# Patient Record
Sex: Male | Born: 1995 | Race: White | Hispanic: No | Marital: Single | Smoking: Never smoker
Health system: Southern US, Community
[De-identification: ages and names within clinical notes are randomized; demographics above are authoritative.]

## PROBLEM LIST (undated history)

## (undated) DIAGNOSIS — F909 Attention-deficit hyperactivity disorder, unspecified type: Secondary | ICD-10-CM

## (undated) HISTORY — PX: OTHER SURGICAL HISTORY: SHX169

---
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
## 2017-10-03 DIAGNOSIS — I861 Scrotal varices: Secondary | ICD-10-CM | POA: Diagnosis not present

## 2017-11-16 DIAGNOSIS — H02816 Retained foreign body in left eye, unspecified eyelid: Secondary | ICD-10-CM | POA: Diagnosis not present

## 2017-11-17 DIAGNOSIS — T1502XA Foreign body in cornea, left eye, initial encounter: Secondary | ICD-10-CM | POA: Diagnosis not present

## 2017-12-05 DIAGNOSIS — R05 Cough: Secondary | ICD-10-CM | POA: Diagnosis not present

## 2017-12-05 DIAGNOSIS — J069 Acute upper respiratory infection, unspecified: Secondary | ICD-10-CM | POA: Diagnosis not present

## 2017-12-14 DIAGNOSIS — J029 Acute pharyngitis, unspecified: Secondary | ICD-10-CM | POA: Diagnosis not present

## 2019-12-31 ENCOUNTER — Other Ambulatory Visit: Payer: Self-pay

## 2019-12-31 ENCOUNTER — Ambulatory Visit
Admission: EM | Admit: 2019-12-31 | Discharge: 2019-12-31 | Disposition: A | Discharge status: Home / Self Care | Payer: 59

## 2019-12-31 DIAGNOSIS — M79671 Pain in right foot: Secondary | ICD-10-CM

## 2019-12-31 NOTE — ED Provider Notes (Signed)
[facility]    CSN: [id number] Arrival date & time: 12/31/19  0947      History   Chief Complaint Chief Complaint  Patient presents with   Foot Pain    HPI Richard Drake is a 24 y.o. male.   24 year old male comes in for right foot pain after waking up this morning.  States similar symptoms occurred 3 days ago, and resolved after a few hours.  Woke up with some redness and swelling to the top of the right foot medially, with redness improving on its own without management.  However, has had aching to the area and painful weightbearing.  Denies numbness, tingling.  Denies injury/trauma.  Patient is a regular runner, and works as a IT sales professional.  Although physically demanding, denies anything out of the norm.  Has not tried anything for the symptoms.     Past Medical History:  Diagnosis Date   ADHD (attention deficit hyperactivity disorder)    hx. of    Patient Active Problem List   Diagnosis Date Noted   Varicocele 05/15/2015    Past Surgical History:  Procedure Laterality Date   ROBOT ASSISTED LAPAROSCOPIC RADICAL PROSTATECTOMY Left 05/15/2015   Procedure: ROBOTIC ASSISTED LEFT VARICOCELECTOMY;  Surgeon: Sebastian Ache, MD;  Location: [facility];  Service: Urology;  Laterality: Left;   wisdom teeth extractions         Home Medications    Prior to Admission medications   Medication Sig Start Date End Date Taking? Authorizing Provider  busPIRone (BUSPAR) 10 MG tablet Take 10 mg by mouth 2 (two) times daily.   Yes [provider]  Multiple Vitamin (MULTIVITAMIN WITH MINERALS) TABS tablet Take 1 tablet by mouth daily.   Yes [provider]    Family History History reviewed. No pertinent family history.  Social History Social History   Tobacco Use   Smoking status: Never Smoker   Smokeless tobacco: Never Used  Substance Use Topics   Alcohol use: Yes    Comment: social   Drug use: No     Allergies   Patient has no known  allergies.   Review of Systems Review of Systems  Reason unable to perform ROS: See HPI as above.     Physical Exam Triage Vital Signs ED Triage Vitals  Enc Vitals Group     BP 12/31/19 1027 137/85     Pulse Rate 12/31/19 1027 88     Resp 12/31/19 1027 18     Temp 12/31/19 1027 99.9 F (37.7 C)     Temp Source 12/31/19 1027 Oral     SpO2 12/31/19 1027 98 %     Weight --      Height --      Head Circumference --      Peak Flow --      Pain Score 12/31/19 1028 6     Pain Loc --      Pain Edu? --      Excl. in GC? --    No data found.  Updated Vital Signs BP 137/85 (BP Location: Left Arm)    Pulse 88    Temp 99.9 F (37.7 C) (Oral)    Resp 18    SpO2 98%   Physical Exam Constitutional:      General: He is not in acute distress.    Appearance: Normal appearance. He is well-developed. He is not toxic-appearing or diaphoretic.  HENT:     Head: Normocephalic and atraumatic.  Eyes:  Conjunctiva/sclera: Conjunctivae normal.     Pupils: Pupils are equal, round, and reactive to light.  Pulmonary:     Effort: Pulmonary effort is normal. No respiratory distress.     Comments: Speaking in full sentences without difficulty Musculoskeletal:     Cervical back: Normal range of motion and neck supple.     Comments: No obvious erythema, warmth, swelling, contusion. Tenderness to palpation along 1st-2nd MTP. No tenderness to the ankle. Full ROM of ankle and toes, with pain during dorsiflexion and extension of ankle. Sensation intact and equal. Pedal pulse 2+  Skin:    General: Skin is warm and dry.  Neurological:     Mental Status: He is alert and oriented to person, place, and time.      UC Treatments / Results  Labs (all labs ordered are listed, but only abnormal results are displayed) Labs Reviewed - No data to display  EKG   Radiology No results found.  Procedures Procedures (including critical care time)  Medications Ordered in UC Medications - No data to  display  Initial Impression / Assessment and Plan / UC Course  I have reviewed the triage vital signs and the nursing notes.  Pertinent labs & imaging results that were available during my care of the patient were reviewed by me and considered in my medical decision making (see chart for details).    Discussed likely tendinitis causing symptoms.  Will start NSAIDs, ice compress, rest, Ace wrap during activity.  Expected course of healing discussed.  Return precautions given.  Final Clinical Impressions(s) / UC Diagnoses   Final diagnoses:  Right foot pain    ED Prescriptions    None     PDMP not reviewed this encounter.   Belinda Fisher, PA-C 12/31/19 1118

## 2019-12-31 NOTE — Discharge Instructions (Addendum)
Naproxen 440mg  twice a day for the next 5-7 days. Voltaren gel 2g 4 times a day as needed. Ice compress, rest, ace wrap during activity. Follow up with sports medicine if symptoms does not improving, or reoccurs often.

## 2019-12-31 NOTE — ED Triage Notes (Signed)
Pt c/o top of rt foot pain, redness, and swelling since this am. States same thing happen 3 days ago and it went away. Denies injury, states unable to bare weight.

## 2020-12-14 ENCOUNTER — Encounter: Payer: Self-pay | Admitting: Family Medicine

## 2020-12-14 ENCOUNTER — Ambulatory Visit (INDEPENDENT_AMBULATORY_CARE_PROVIDER_SITE_OTHER): Payer: 59 | Admitting: Family Medicine

## 2020-12-14 ENCOUNTER — Other Ambulatory Visit: Payer: Self-pay

## 2020-12-14 VITALS — BP 137/81 | HR 88 | Temp 98.1°F | Ht 71.0 in | Wt 181.3 lb

## 2020-12-14 DIAGNOSIS — F909 Attention-deficit hyperactivity disorder, unspecified type: Secondary | ICD-10-CM | POA: Diagnosis not present

## 2020-12-14 DIAGNOSIS — Z79899 Other long term (current) drug therapy: Secondary | ICD-10-CM

## 2020-12-14 DIAGNOSIS — F419 Anxiety disorder, unspecified: Secondary | ICD-10-CM | POA: Diagnosis not present

## 2020-12-14 DIAGNOSIS — A630 Anogenital (venereal) warts: Secondary | ICD-10-CM | POA: Insufficient documentation

## 2020-12-14 DIAGNOSIS — Z114 Encounter for screening for human immunodeficiency virus [HIV]: Secondary | ICD-10-CM | POA: Diagnosis not present

## 2020-12-14 DIAGNOSIS — A530 Latent syphilis, unspecified as early or late: Secondary | ICD-10-CM | POA: Insufficient documentation

## 2020-12-14 MED ORDER — ESCITALOPRAM OXALATE 10 MG PO TABS: ORAL_TABLET | ORAL | 3 refills | Status: DC

## 2020-12-14 NOTE — Progress Notes (Signed)
Richard Drake - 25 y.o. male MRN [id number]  Date of birth: 1996/02/04  Subjective Chief Complaint  Patient presents with   Establish Care    HPI Richard Drake is a 25 y.o. male here today for initial visit to establish care.  He has a history of ADHD, anxiety, latent syphilis and genital warts.    He was prescribed buspirone previously for treatment of anxiety but did not feel that this was very effective.  He has not tried any SSRI's for treatment of anxiety but would be open to trying this.   He has history of latent syphilis that was treated with PCN with reduction in titers after treatment.  He has also had genital/anal warts and had these treated by dermatology with cryosurgery.  He is potentially interested in starting PrEP for HIV prevention.  He has never taken this before.  He is concerned about renal side effects related to Truvada and would prefer to take Descovy.    ROS:  A comprehensive ROS was completed and negative except as noted per HPI  Allergies  Allergen Reactions   Fluoxetine Other (See Comments)    Past Medical History:  Diagnosis Date   ADHD (attention deficit hyperactivity disorder)    hx. of    Past Surgical History:  Procedure Laterality Date   ROBOT ASSISTED LAPAROSCOPIC RADICAL PROSTATECTOMY Left 05/15/2015   Procedure: ROBOTIC ASSISTED LEFT VARICOCELECTOMY;  Surgeon: Sebastian Ache, MD;  Location: [facility];  Service: Urology;  Laterality: Left;   wisdom teeth extractions      Social History   Socioeconomic History   Marital status: Single    Spouse name: Not on file   Number of children: Not on file   Years of education: Not on file   Highest education level: Not on file  Occupational History   Not on file  Tobacco Use   Smoking status: Never   Smokeless tobacco: Never  Vaping Use   Vaping Use: Former  Substance and Sexual Activity   Alcohol use: Yes    Alcohol/week: 2.0 - 3.0 standard drinks    Types: 2 - 3 Standard drinks or equivalent  per week    Comment: social   Drug use: No   Sexual activity: Yes    Partners: Male    Birth control/protection: Condom  Other Topics Concern   Not on file  Social History Narrative   Not on file   Social Determinants of Health   Financial Resource Strain: Not on file  Food Insecurity: Not on file  Transportation Needs: Not on file  Physical Activity: Not on file  Stress: Not on file  Social Connections: Not on file    Family History  Problem Relation Age of Onset   Prostate cancer Paternal Grandfather     Health Maintenance  Topic Date Due   Pneumococcal Vaccine 4-71 Years old (1 - PCV) Never done   Hepatitis C Screening  Never done   COVID-19 Vaccine (3 - Moderna risk series) 02/20/2020   INFLUENZA VACCINE  01/25/2021   TETANUS/TDAP  08/21/2029   HPV VACCINES  Completed   HIV Screening  Completed     ----------------------------------------------------------------------------------------------------------------------------------------------------------------------------------------------------------------- Physical Exam BP 137/81 (BP Location: Left Arm, Patient Position: Sitting, Cuff Size: Normal)   Pulse 88   Temp 98.1 F (36.7 C)   Ht 5\' 11"  (1.803 m)   Wt 181 lb 4.8 oz (82.2 kg)   SpO2 100%   BMI 25.29 kg/m   Physical Exam Constitutional:  Appearance: Normal appearance.  HENT:     Head: Normocephalic and atraumatic.  Eyes:     General: No scleral icterus. Cardiovascular:     Rate and Rhythm: Normal rate and regular rhythm.  Pulmonary:     Effort: Pulmonary effort is normal.     Breath sounds: Normal breath sounds.  Musculoskeletal:     Cervical back: Neck supple.  Skin:    General: Skin is warm and dry.  Neurological:     General: No focal deficit present.     Mental Status: He is alert.  Psychiatric:        Mood and Affect: Mood normal.        Behavior: Behavior normal.     ------------------------------------------------------------------------------------------------------------------------------------------------------------------------------------------------------------------- Assessment and Plan  Anxiety Treatment options discussed.   Start lexapro 10mg  daily.  Side effects reviewed including sexual side effects. Return in about 4 weeks (around 01/11/2021) for Anxiety.   Attention deficit hyperactivity disorder (ADHD) Referral to psychology for evaluation of adult ADD  Screening for HIV (human immunodeficiency virus) PrEP dsicussed including need to monitor renal function and HIV testing q3 months. Hep b testing ordered today. Check HIV antibody.  He is concerned about potential renal side effects and we'll see if we can get Descovy approved.    Meds ordered this encounter  Medications   DISCONTD: escitalopram (LEXAPRO) 10 MG tablet    Sig: Take 1/2 tab po daily x1 week then increase to full tab daily    Dispense:  30 tablet    Refill:  3   escitalopram (LEXAPRO) 10 MG tablet    Sig: Take 1/2 tab po daily x1 week then increase to full tab daily    Dispense:  30 tablet    Refill:  3    Return in about 4 weeks (around 01/11/2021) for Anxiety.    This visit occurred during the SARS-CoV-2 public health emergency.  Safety protocols were in place, including screening questions prior to the visit, additional usage of staff PPE, and extensive cleaning of exam room while observing appropriate contact time as indicated for disinfecting solutions.

## 2020-12-14 NOTE — Patient Instructions (Signed)
Great to meet you today! Let's try lexapro 1/2 tab x1 week then increase to

## 2020-12-15 LAB — BASIC METABOLIC PANEL
BUN: 15 mg/dL (ref 7–25)
CO2: 26 mmol/L (ref 20–32)
Calcium: 10 mg/dL (ref 8.6–10.3)
Chloride: 103 mmol/L (ref 98–110)
Creat: 0.79 mg/dL (ref 0.60–1.35)
Glucose, Bld: 97 mg/dL (ref 65–99)
Potassium: 4.3 mmol/L (ref 3.5–5.3)
Sodium: 140 mmol/L (ref 135–146)

## 2020-12-15 LAB — HIV ANTIBODY (ROUTINE TESTING W REFLEX): HIV 1&2 Ab, 4th Generation: NONREACTIVE

## 2020-12-15 LAB — HEPATITIS B CORE ANTIBODY, TOTAL: Hep B Core Total Ab: NONREACTIVE

## 2020-12-15 LAB — HEPATITIS B SURFACE ANTIBODY,QUALITATIVE: Hep B S Ab: REACTIVE — AB

## 2020-12-16 ENCOUNTER — Encounter: Payer: Self-pay | Admitting: Family Medicine

## 2020-12-16 ENCOUNTER — Other Ambulatory Visit: Payer: Self-pay | Admitting: Family Medicine

## 2020-12-16 MED ORDER — DESCOVY 200-25 MG PO TABS: 1.0000 | ORAL_TABLET | Freq: Every day | ORAL | 2 refills | Status: DC

## 2020-12-17 ENCOUNTER — Encounter: Payer: Self-pay | Admitting: Family Medicine

## 2020-12-17 DIAGNOSIS — F909 Attention-deficit hyperactivity disorder, unspecified type: Secondary | ICD-10-CM | POA: Insufficient documentation

## 2020-12-17 DIAGNOSIS — Z114 Encounter for screening for human immunodeficiency virus [HIV]: Secondary | ICD-10-CM | POA: Insufficient documentation

## 2020-12-17 NOTE — Assessment & Plan Note (Signed)
Referral to psychology for evaluation of adult ADD

## 2020-12-17 NOTE — Assessment & Plan Note (Addendum)
Treatment options discussed.   Start lexapro 10mg  daily.  Side effects reviewed including sexual side effects. Return in about 4 weeks (around 01/11/2021) for Anxiety.

## 2020-12-17 NOTE — Assessment & Plan Note (Signed)
PrEP dsicussed including need to monitor renal function and HIV testing q3 months. Hep b testing ordered today. Check HIV antibody.  He is concerned about potential renal side effects and we'll see if we can get Descovy approved.

## 2020-12-18 NOTE — Telephone Encounter (Signed)
Please let patient know that Descovy denied.  Does he want to try Truvada?

## 2021-01-12 ENCOUNTER — Ambulatory Visit: Payer: 59 | Admitting: Family Medicine

## 2021-01-19 ENCOUNTER — Encounter: Payer: Self-pay | Admitting: *Deleted

## 2021-01-19 NOTE — Telephone Encounter (Signed)
My chart message sent to pt.

## 2021-01-25 ENCOUNTER — Encounter: Payer: Self-pay | Admitting: Family Medicine

## 2021-01-25 ENCOUNTER — Other Ambulatory Visit: Payer: Self-pay

## 2021-01-25 ENCOUNTER — Ambulatory Visit: Payer: 59 | Admitting: Family Medicine

## 2021-01-25 DIAGNOSIS — A630 Anogenital (venereal) warts: Secondary | ICD-10-CM | POA: Insufficient documentation

## 2021-01-25 DIAGNOSIS — F909 Attention-deficit hyperactivity disorder, unspecified type: Secondary | ICD-10-CM | POA: Diagnosis not present

## 2021-01-25 DIAGNOSIS — Z114 Encounter for screening for human immunodeficiency virus [HIV]: Secondary | ICD-10-CM

## 2021-01-25 DIAGNOSIS — F419 Anxiety disorder, unspecified: Secondary | ICD-10-CM

## 2021-01-25 MED ORDER — ESCITALOPRAM OXALATE 20 MG PO TABS
ORAL_TABLET | ORAL | 3 refills | Status: DC
Start: 2021-01-25 — End: 2021-03-29

## 2021-01-25 NOTE — Assessment & Plan Note (Signed)
Anxiety has improved however still feels like he could have better control.  Will increase dose of Lexapro as he is tolerating this well so far.

## 2021-01-25 NOTE — Progress Notes (Signed)
Richard Drake - 25 y.o. male MRN [id number]  Date of birth: 03/22/1996  Subjective Chief Complaint  Patient presents with   Follow-up    HPI Richard Drake is a 25 year old male here today for follow-up visit.  We discussed starting PrEP at his previous visit.  He was concerned about a possible renal issues related to Truvada.  He did want to try Descovy however insurance would not cover this without initial trial of Truvada.  Reports that he does not want to try Truvada at this time.  He was started on Lexapro at last visit.  He reports that he is doing well with this.  He has had much less side effects with this compared to fluoxetine and buspirone.  He does continue to have some breakthrough anxiety and would like to try increasing the dose on his Lexapro.    Depression screen [employer] 2/9 01/25/2021 12/14/2020  Decreased Interest 1 1  Down, Depressed, Hopeless 1 1  PHQ - 2 Score 2 2  Altered sleeping 2 3  Tired, decreased energy 2 3  Change in appetite 1 2  Feeling bad or failure about yourself  0 1  Trouble concentrating 1 3  Moving slowly or fidgety/restless 0 1  Suicidal thoughts 0 0  PHQ-9 Score 8 15  Difficult doing work/chores Very difficult Somewhat difficult   GAD 7 : Generalized Anxiety Score 01/25/2021 12/14/2020  Nervous, Anxious, on Edge 2 3  Control/stop worrying 1 2  Worry too much - different things 1 1  Trouble relaxing 1 2  Restless 2 1  Easily annoyed or irritable 0 0  Afraid - awful might happen 0 0  Total GAD 7 Score 7 9  Anxiety Difficulty Somewhat difficult Somewhat difficult    ROS:  A comprehensive ROS was completed and negative except as noted per HPI  Allergies  Allergen Reactions   Fluoxetine Other (See Comments)    Past Medical History:  Diagnosis Date   ADHD (attention deficit hyperactivity disorder)    hx. of    Past Surgical History:  Procedure Laterality Date   ROBOT ASSISTED LAPAROSCOPIC RADICAL PROSTATECTOMY Left 05/15/2015   Procedure:  ROBOTIC ASSISTED LEFT VARICOCELECTOMY;  Surgeon: Sebastian Ache, MD;  Location: [facility];  Service: Urology;  Laterality: Left;   wisdom teeth extractions      Social History   Socioeconomic History   Marital status: Single    Spouse name: Not on file   Number of children: Not on file   Years of education: Not on file   Highest education level: Not on file  Occupational History   Not on file  Tobacco Use   Smoking status: Never   Smokeless tobacco: Never  Vaping Use   Vaping Use: Former  Substance and Sexual Activity   Alcohol use: Yes    Alcohol/week: 2.0 - 3.0 standard drinks    Types: 2 - 3 Standard drinks or equivalent per week    Comment: social   Drug use: No   Sexual activity: Yes    Partners: Male    Birth control/protection: Condom  Other Topics Concern   Not on file  Social History Narrative   Not on file   Social Determinants of Health   Financial Resource Strain: Not on file  Food Insecurity: Not on file  Transportation Needs: Not on file  Physical Activity: Not on file  Stress: Not on file  Social Connections: Not on file    Family History  Problem Relation Age of  Onset   Prostate cancer Paternal Grandfather     Health Maintenance  Topic Date Due   Pneumococcal Vaccine 45-37 Years old (1 - PCV) Never done   Hepatitis C Screening  Never done   COVID-19 Vaccine (3 - Moderna risk series) 02/20/2020   INFLUENZA VACCINE  01/25/2021   TETANUS/TDAP  08/21/2029   HPV VACCINES  Completed   HIV Screening  Completed     ----------------------------------------------------------------------------------------------------------------------------------------------------------------------------------------------------------------- Physical Exam BP 124/78 (BP Location: Left Arm, Patient Position: Sitting, Cuff Size: Normal)   Pulse 83   Temp 97.7 F (36.5 C)   Ht 5\' 11"  (1.803 m)   Wt 178 lb (80.7 kg)   SpO2 98%   BMI 24.83 kg/m   Physical  Exam Constitutional:      Appearance: Normal appearance.  Skin:    General: Skin is warm.  Neurological:     Mental Status: He is alert.  Psychiatric:        Mood and Affect: Mood normal.        Behavior: Behavior normal.    ------------------------------------------------------------------------------------------------------------------------------------------------------------------------------------------------------------------- Assessment and Plan  Attention deficit hyperactivity disorder (ADHD) Appointment pending with [place] attention specialist for evaluation of ADD.  Anxiety Anxiety has improved however still feels like he could have better control.  Will increase dose of Lexapro as he is tolerating this well so far.  Screening for HIV (human immunodeficiency virus) Medications for PrEP discussed previously.  He would like to hold off on starting Truvada at this time.  Condoms encouraged.   Meds ordered this encounter  Medications   escitalopram (LEXAPRO) 20 MG tablet    Sig: Take 20mg  PO daily    Dispense:  30 tablet    Refill:  3    Return in about 2 months (around 03/27/2021) for GAD.    This visit occurred during the SARS-CoV-2 public health emergency.  Safety protocols were in place, including screening questions prior to the visit, additional usage of staff PPE, and extensive cleaning of exam room while observing appropriate contact time as indicated for disinfecting solutions.

## 2021-01-25 NOTE — Assessment & Plan Note (Signed)
Appointment pending with [place] attention specialist for evaluation of ADD.

## 2021-01-25 NOTE — Assessment & Plan Note (Addendum)
Medications for PrEP discussed previously.  He would like to hold off on starting Truvada at this time.  Condoms encouraged.

## 2021-01-25 NOTE — Patient Instructions (Signed)
Great to see you today! Increase lexapro to 20mg  daily

## 2021-02-16 ENCOUNTER — Encounter: Payer: Self-pay | Admitting: Family Medicine

## 2021-02-16 ENCOUNTER — Other Ambulatory Visit: Payer: Self-pay | Admitting: Family Medicine

## 2021-02-16 MED ORDER — ONDANSETRON 4 MG PO TBDP: 4.0000 mg | ORAL_TABLET | Freq: Three times a day (TID) | ORAL | 0 refills | Status: DC | PRN

## 2021-03-17 ENCOUNTER — Encounter: Payer: Self-pay | Admitting: Family Medicine

## 2021-03-19 ENCOUNTER — Ambulatory Visit
Admission: EM | Admit: 2021-03-19 | Discharge: 2021-03-19 | Disposition: A | Discharge status: Home / Self Care | Payer: 59 | Attending: Internal Medicine | Admitting: Internal Medicine

## 2021-03-19 ENCOUNTER — Ambulatory Visit (INDEPENDENT_AMBULATORY_CARE_PROVIDER_SITE_OTHER): Payer: 59

## 2021-03-19 ENCOUNTER — Other Ambulatory Visit: Payer: Self-pay

## 2021-03-19 DIAGNOSIS — J208 Acute bronchitis due to other specified organisms: Secondary | ICD-10-CM

## 2021-03-19 DIAGNOSIS — R059 Cough, unspecified: Secondary | ICD-10-CM

## 2021-03-19 DIAGNOSIS — J029 Acute pharyngitis, unspecified: Secondary | ICD-10-CM | POA: Diagnosis not present

## 2021-03-19 LAB — POCT RAPID STREP A (OFFICE): Rapid Strep A Screen: NEGATIVE

## 2021-03-19 MED ORDER — PREDNISONE 20 MG PO TABS: 40.0000 mg | ORAL_TABLET | Freq: Every day | ORAL | 0 refills | Status: AC

## 2021-03-19 MED ORDER — PROMETHAZINE-DM 6.25-15 MG/5ML PO SYRP: 5.0000 mL | ORAL_SOLUTION | Freq: Four times a day (QID) | ORAL | 0 refills | Status: DC | PRN

## 2021-03-19 MED ORDER — ALBUTEROL SULFATE HFA 108 (90 BASE) MCG/ACT IN AERS: 1.0000 | INHALATION_SPRAY | Freq: Four times a day (QID) | RESPIRATORY_TRACT | 0 refills | Status: DC | PRN

## 2021-03-19 NOTE — ED Provider Notes (Signed)
[facility]    CSN: [id number] Arrival date & time: 03/19/21  3300      History   Chief Complaint Chief Complaint  Patient presents with   Cough    HPI Richard Drake is a 25 y.o. male.   Patient presents with productive cough that has been present for approximately 2 weeks.  Patient also had some nasal congestion  when symptoms first started symptoms but has now resolved.  Cough is productive with yellow-colored phlegm.  Denies any chest pain or shortness of breath.  Did have low-grade fever 99 at home.  Has been taking Zyrtec at home without any relief.   Cough  Past Medical History:  Diagnosis Date   ADHD (attention deficit hyperactivity disorder)    hx. 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 Authorizing Provider  albuterol (VENTOLIN HFA) 108 (90 Base) MCG/ACT inhaler Inhale 1-2 puffs into the lungs every 6 (six) hours as needed for wheezing or shortness of breath (cough). 03/19/21  Yes Lance Muss, FNP  predniSONE (DELTASONE) 20 MG tablet Take 2 tablets (40 mg total) by mouth daily for 5 days. 03/19/21 03/24/21 Yes Lance Muss, FNP  promethazine-dextromethorphan (PROMETHAZINE-DM) 6.25-15 MG/5ML syrup Take 5 mLs by mouth 4 (four) times daily as needed for cough. 03/19/21  Yes Lance Muss, FNP   escitalopram (LEXAPRO) 20 MG tablet Take 20mg  PO daily 01/25/21   03/27/21, DO  imiquimod (ALDARA) 5 % cream 1 application at bedtime, leave on for 8 hours then wash off    [provider]  Multiple Vitamin (MULTIVITAMIN WITH MINERALS) TABS tablet Take 1 tablet by mouth daily.    [provider]  ondansetron (ZOFRAN ODT) 4 MG disintegrating tablet Take 1 tablet (4 mg total) by mouth every 8 (eight) hours as needed for nausea or vomiting. 02/16/21   02/18/21, DO    Family History Family History  Problem Relation Age of Onset   Prostate cancer Paternal Grandfather     Social History Social History   Tobacco Use   Smoking status: Never   Smokeless tobacco: Never  Vaping Use   Vaping Use: Former  Substance Use Topics   Alcohol use: Yes    Alcohol/week: 2.0 - 3.0 standard drinks    Types: 2 - 3 Standard drinks or equivalent per week    Comment: social   Drug use: No     Allergies   Fluoxetine   Review of Systems Review of Systems Per HPI  Physical Exam Triage Vital Signs ED Triage Vitals  Enc Vitals Group     BP 03/19/21 0837 (!) 130/92     Pulse Rate 03/19/21 0837 74     Resp 03/19/21 0837 20     Temp 03/19/21 0837 98.4 F (36.9 C)  Temp Source 03/19/21 0837 Oral     SpO2 03/19/21 0837 98 %     Weight --      Height --      Head Circumference --      Peak Flow --      Pain Score 03/19/21 0839 0     Pain Loc --      Pain Edu? --      Excl. in GC? --    No data found.  Updated Vital Signs BP (!) 130/92 (BP Location: Left Arm)   Pulse 74   Temp 98.4 F (36.9 C) (Oral)   Resp 20   SpO2 98%   Visual Acuity Right Eye Distance:   Left Eye Distance:   Bilateral Distance:    Right Eye Near:   Left Eye Near:    Bilateral Near:     Physical Exam Constitutional:      General: He is not in acute distress.    Appearance: Normal appearance. He is not ill-appearing, toxic-appearing or diaphoretic.  HENT:     Head:  Normocephalic and atraumatic.     Right Ear: Tympanic membrane and ear canal normal.     Left Ear: Tympanic membrane and ear canal normal.     Nose: Nose normal.     Mouth/Throat:     Mouth: Mucous membranes are moist.     Pharynx: Posterior oropharyngeal erythema present.  Eyes:     Extraocular Movements: Extraocular movements intact.     Conjunctiva/sclera: Conjunctivae normal.  Cardiovascular:     Rate and Rhythm: Normal rate and regular rhythm.     Pulses: Normal pulses.     Heart sounds: Normal heart sounds.  Pulmonary:     Effort: Pulmonary effort is normal. No respiratory distress.     Breath sounds: Normal breath sounds. No stridor. No wheezing or rhonchi.  Skin:    General: Skin is warm and dry.  Neurological:     General: No focal deficit present.     Mental Status: He is alert and oriented to person, place, and time. Mental status is at baseline.  Psychiatric:        Mood and Affect: Mood normal.        Behavior: Behavior normal.        Thought Content: Thought content normal.        Judgment: Judgment normal.     UC Treatments / Results  Labs (all labs ordered are listed, but only abnormal results are displayed) Labs Reviewed  CULTURE, GROUP A STREP [facility])  POCT RAPID STREP A (OFFICE)    EKG   Radiology DG Chest 2 View  Result Date: 03/19/2021 CLINICAL DATA:  persistent cough EXAM: CHEST - 2 VIEW COMPARISON:  May 02, 2016 FINDINGS: The cardiomediastinal silhouette is normal in contour. No pleural effusion. No pneumothorax. No acute pleuroparenchymal abnormality. Visualized abdomen is unremarkable. No acute osseous abnormality noted. IMPRESSION: No acute cardiopulmonary abnormality. Electronically Signed   By: Meda Klinefelter M.D.   On: 03/19/2021 09:46    Procedures Procedures (including critical care time)  Medications Ordered in UC Medications - No data to display  Initial Impression / Assessment and Plan / UC Course  I have reviewed the  triage vital signs and the nursing notes.  Pertinent labs & imaging results that were available during my care of the patient were reviewed by me and considered in my medical decision making (see chart for details).     Chest x-ray was negative  for any acute cardiopulmonary process.  Suspect start of viral bronchitis.  Will treat with prednisone x5 days to help decrease inflammation in chest and help with cough.  Also prescribed Promethazine DM to help with cough as needed.  Advised patient that cough medication may cause drowsiness.  Albuterol inhaler prescribed as well.  Do not think COVID-19 testing is necessary at this time due to duration of symptoms.  Patient also has negative COVID-19 test at home recently.  Rapid strep test negative.  Throat culture is pending.  Patient is nontoxic-appearing.Discussed strict return precautions. Patient verbalized understanding and is agreeable with plan.  Final Clinical Impressions(s) / UC Diagnoses   Final diagnoses:  Sore throat  Viral bronchitis  Cough     Discharge Instructions      You have been prescribed prednisone steroid to decrease inflammation and help with cough, a cough medication, and an albuterol inhaler.  Please be advised that cough medication can cause drowsiness.  Follow-up if symptoms persist.  Go to the hospital shortness of breath develops.     ED Prescriptions     Medication Sig Dispense Auth. Provider   predniSONE (DELTASONE) 20 MG tablet Take 2 tablets (40 mg total) by mouth daily for 5 days. 10 tablet Lance Muss, FNP   albuterol (VENTOLIN HFA) 108 (90 Base) MCG/ACT inhaler Inhale 1-2 puffs into the lungs every 6 (six) hours as needed for wheezing or shortness of breath (cough). 1 each Lance Muss, FNP   promethazine-dextromethorphan (PROMETHAZINE-DM) 6.25-15 MG/5ML syrup Take 5 mLs by mouth 4 (four) times daily as needed for cough. 118 mL Lance Muss, FNP      I have reviewed the PDMP during this  encounter.   Lance Muss, FNP 03/19/21 1021

## 2021-03-19 NOTE — ED Triage Notes (Signed)
Pt c/o cough onset about 2 weeks ago. Pt states covid(-) at home. States coworker has PNA and is concerned he may also have PNA.   States tried zyrtec at home without relief.

## 2021-03-19 NOTE — Discharge Instructions (Signed)
You have been prescribed prednisone steroid to decrease inflammation and help with cough, a cough medication, and an albuterol inhaler.  Please be advised that cough medication can cause drowsiness.  Follow-up if symptoms persist.  Go to the hospital shortness of breath develops.

## 2021-03-22 LAB — CULTURE, GROUP A STREP (THRC)

## 2021-03-29 ENCOUNTER — Other Ambulatory Visit: Payer: Self-pay

## 2021-03-29 ENCOUNTER — Ambulatory Visit: Payer: 59 | Admitting: Family Medicine

## 2021-03-29 ENCOUNTER — Encounter: Payer: Self-pay | Admitting: Family Medicine

## 2021-03-29 VITALS — BP 127/82 | HR 80 | Ht 71.0 in | Wt 188.0 lb

## 2021-03-29 DIAGNOSIS — F419 Anxiety disorder, unspecified: Secondary | ICD-10-CM | POA: Diagnosis not present

## 2021-03-29 DIAGNOSIS — A530 Latent syphilis, unspecified as early or late: Secondary | ICD-10-CM | POA: Diagnosis not present

## 2021-03-29 DIAGNOSIS — Z114 Encounter for screening for human immunodeficiency virus [HIV]: Secondary | ICD-10-CM | POA: Diagnosis not present

## 2021-03-29 DIAGNOSIS — Z113 Encounter for screening for infections with a predominantly sexual mode of transmission: Secondary | ICD-10-CM | POA: Diagnosis not present

## 2021-03-29 MED ORDER — HYDROXYZINE PAMOATE 25 MG PO CAPS: 25.0000 mg | ORAL_CAPSULE | Freq: Three times a day (TID) | ORAL | 1 refills | Status: DC | PRN

## 2021-03-29 MED ORDER — ESCITALOPRAM OXALATE 20 MG PO TABS: ORAL_TABLET | ORAL | 3 refills | Status: DC

## 2021-03-29 NOTE — Progress Notes (Signed)
[name] - 25 y.o. male MRN [id number]  Date of birth: 1995-07-29  Subjective Chief Complaint  Patient presents with   Anxiety    HPI Richard Drake is a 25 year old male here today for follow-up of anxiety.  Started on Lexapro earlier this year and is doing quite well with this.  He does continue to have some episodes of breakthrough anxiety and requests a medication he can take as needed for any breakthrough episodes.  He has had no issues with tolerating Lexapro and denies significant side effects.  He would like to have STI screening.  He denies symptoms at this time.  ROS:  A comprehensive ROS was completed and negative except as noted per HPI  Allergies  Allergen Reactions   Fluoxetine Other (See Comments)    Past Medical History:  Diagnosis Date   ADHD (attention deficit hyperactivity disorder)    hx. of    Past Surgical History:  Procedure Laterality Date   ROBOT ASSISTED LAPAROSCOPIC RADICAL PROSTATECTOMY Left 05/15/2015   Procedure: ROBOTIC ASSISTED LEFT VARICOCELECTOMY;  Surgeon: Sebastian Ache, MD;  Location: [facility];  Service: Urology;  Laterality: Left;   wisdom teeth extractions      Social History   Socioeconomic History   Marital status: Single    Spouse name: Not on file   Number of children: Not on file   Years of education: Not on file   Highest education level: Not on file  Occupational History   Not on file  Tobacco Use   Smoking status: Never   Smokeless tobacco: Never  Vaping Use   Vaping Use: Former  Substance and Sexual Activity   Alcohol use: Yes    Alcohol/week: 2.0 - 3.0 standard drinks    Types: 2 - 3 Standard drinks or equivalent per week    Comment: social   Drug use: No   Sexual activity: Yes    Partners: Male    Birth control/protection: Condom  Other Topics Concern   Not on file  Social History Narrative   Not on file   Social Determinants of Health   Financial Resource Strain: Not on file  Food Insecurity: Not on  file  Transportation Needs: Not on file  Physical Activity: Not on file  Stress: Not on file  Social Connections: Not on file    Family History  Problem Relation Age of Onset   Prostate cancer Paternal Grandfather     Health Maintenance  Topic Date Due   Hepatitis C Screening  Never done   COVID-19 Vaccine (3 - Moderna risk series) 02/20/2020   TETANUS/TDAP  08/21/2029   INFLUENZA VACCINE  Completed   HPV VACCINES  Completed   HIV Screening  Completed     ----------------------------------------------------------------------------------------------------------------------------------------------------------------------------------------------------------------- Physical Exam BP 127/82 (BP Location: Left Arm, Patient Position: Sitting, Cuff Size: Normal)   Pulse 80   Ht 5\' 11"  (1.803 m)   Wt 188 lb (85.3 kg)   SpO2 99%   BMI 26.22 kg/m   Physical Exam Constitutional:      Appearance: Normal appearance.  Eyes:     General: No scleral icterus. Musculoskeletal:     Cervical back: Neck supple.  Neurological:     General: No focal deficit present.     Mental Status: He is alert.  Psychiatric:        Mood and Affect: Mood normal.        Behavior: Behavior normal.    ------------------------------------------------------------------------------------------------------------------------------------------------------------------------------------------------------------------- Assessment and Plan  Screening for STD (  sexually transmitted disease) Orders Placed This Encounter  Procedures   Chlamydia/Neisseria Gonorrhoeae RNA,TMA,Urogenital   RPR   HIV antibody (with reflex)     Anxiety Lexapro is working quite well for management of his anxiety.  We will add hydroxyzine as needed for breakthrough anxiety symptoms.  I will plan to see him back in about 4 months.  We discussed that if hydroxyzine is not effective we can discuss alternatives.   Meds ordered this  encounter  Medications   hydrOXYzine (VISTARIL) 25 MG capsule    Sig: Take 1 capsule (25 mg total) by mouth 3 (three) times daily as needed for anxiety.    Dispense:  30 capsule    Refill:  1   escitalopram (LEXAPRO) 20 MG tablet    Sig: Take 20mg  PO daily    Dispense:  30 tablet    Refill:  3    Return in about 4 months (around 07/30/2021) for GAD.    This visit occurred during the SARS-CoV-2 public health emergency.  Safety protocols were in place, including screening questions prior to the visit, additional usage of staff PPE, and extensive cleaning of exam room while observing appropriate contact time as indicated for disinfecting solutions.

## 2021-03-29 NOTE — Patient Instructions (Signed)
Great to see you today! We'll be in touch with lab results.  Try addition of hydroxyzine as needed for anxiety.  See me again in 4 months.

## 2021-03-29 NOTE — Assessment & Plan Note (Signed)
Lexapro is working quite well for management of his anxiety.  We will add hydroxyzine as needed for breakthrough anxiety symptoms.  I will plan to see him back in about 4 months.  We discussed that if hydroxyzine is not effective we can discuss alternatives.

## 2021-03-29 NOTE — Assessment & Plan Note (Signed)
Orders Placed This Encounter  Procedures  . Chlamydia/Neisseria Gonorrhoeae RNA,TMA,Urogenital  . RPR  . HIV antibody (with reflex)

## 2021-04-01 LAB — CHLAMYDIA/NEISSERIA GONORRHOEAE RNA,TMA,UROGENTIAL
C. trachomatis RNA, TMA: NOT DETECTED
N. gonorrhoeae RNA, TMA: NOT DETECTED

## 2021-04-01 LAB — HIV ANTIBODY (ROUTINE TESTING W REFLEX): HIV 1&2 Ab, 4th Generation: NONREACTIVE

## 2021-04-01 LAB — RPR: RPR Ser Ql: NONREACTIVE

## 2021-05-03 ENCOUNTER — Other Ambulatory Visit: Payer: Self-pay

## 2021-05-03 ENCOUNTER — Encounter: Payer: Self-pay | Admitting: Physician Assistant

## 2021-05-03 ENCOUNTER — Ambulatory Visit: Payer: 59 | Admitting: Physician Assistant

## 2021-05-03 ENCOUNTER — Encounter: Payer: Self-pay | Admitting: Family Medicine

## 2021-05-03 VITALS — BP 141/71 | HR 80 | Ht 71.0 in | Wt 192.0 lb

## 2021-05-03 DIAGNOSIS — R1909 Other intra-abdominal and pelvic swelling, mass and lump: Secondary | ICD-10-CM | POA: Diagnosis not present

## 2021-05-03 NOTE — Progress Notes (Signed)
   Subjective:    Patient ID: Richard Drake, male    DOB: 11-Oct-1995, 25 y.o.   MRN: [id number]  HPI Pt is a 25 yo male who presents to the clinic to discuss mass felt of the right inguinal area. He is concerned about a hernia. He does work as a Estate agent heavy thing. He denies feeling any bulging, pressure, pain with lifting. He has noticed for the last week. It is more tender to touch than painful. Never had anything like this before. He has had a varicocele of left testicle. No penile discharge. No fever.   . 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     Objective:   Physical Exam Abdominal:     General: There is no distension.     Palpations: Abdomen is soft.     Tenderness: There is no abdominal tenderness. There is no guarding or rebound.     Hernia: No hernia is present.     Comments: 1cm by 1cm firm but mobile mass of the right inguinal to perineal area that is tender to palpation. No testicular masses or tenderness.  No direct or indirect hernia palpated.  No warmth/redness/swelling.           Assessment & Plan:  Marland KitchenMarland KitchenDamichael was seen today for hernia.  Diagnoses and all orders for this visit:  Mass of right inguinal region -     US Pelvis Limited; Future  I suspect the mass is non inflamed sebaceous cyst of lymph node not hernia. Will get ultrasound to confirm and get a better treatment plan. For now warm compresses and leave  it alone.

## 2021-05-12 ENCOUNTER — Other Ambulatory Visit: Payer: 59

## 2021-05-19 ENCOUNTER — Ambulatory Visit (INDEPENDENT_AMBULATORY_CARE_PROVIDER_SITE_OTHER): Payer: 59

## 2021-05-19 ENCOUNTER — Other Ambulatory Visit: Payer: Self-pay

## 2021-05-19 DIAGNOSIS — R1909 Other intra-abdominal and pelvic swelling, mass and lump: Secondary | ICD-10-CM

## 2021-05-24 NOTE — Progress Notes (Signed)
No acute internal findings. Do you see feel mass?

## 2021-06-14 ENCOUNTER — Encounter: Payer: Self-pay | Admitting: Family Medicine

## 2021-06-14 ENCOUNTER — Ambulatory Visit: Payer: 59 | Admitting: Family Medicine

## 2021-06-14 ENCOUNTER — Other Ambulatory Visit: Payer: Self-pay

## 2021-06-14 VITALS — BP 145/81 | HR 69 | Temp 97.2°F | Ht 71.0 in | Wt 192.0 lb

## 2021-06-14 DIAGNOSIS — F419 Anxiety disorder, unspecified: Secondary | ICD-10-CM

## 2021-06-14 DIAGNOSIS — R5383 Other fatigue: Secondary | ICD-10-CM | POA: Diagnosis not present

## 2021-06-14 DIAGNOSIS — R635 Abnormal weight gain: Secondary | ICD-10-CM

## 2021-06-14 NOTE — Progress Notes (Signed)
Richard Drake - 25 y.o. male MRN [id number]  Date of birth: Nov 12, 1995  Subjective Chief Complaint  Patient presents with   Alopecia   Weight Gain   Fatigue    HPI Richard Drake is a 25 year old male here today with complaint of fatigue, abnormal weight gain and hair loss.  He has noticed the symptoms over the past several months.  He does note Lexapro is controlling anxiety symptoms fairly well.  He has not noticed any other symptoms including dry skin or nail changes, constipation or diarrhea or significant changes in libido.  He denies any changes to his diet and exercise patterns over the past few months.  ROS:  A comprehensive ROS was completed and negative except as noted per HPI  Allergies  Allergen Reactions   Fluoxetine Other (See Comments)    Past Medical History:  Diagnosis Date   ADHD (attention deficit hyperactivity disorder)    hx. of    Past Surgical History:  Procedure Laterality Date   ROBOT ASSISTED LAPAROSCOPIC RADICAL PROSTATECTOMY Left 05/15/2015   Procedure: ROBOTIC ASSISTED LEFT VARICOCELECTOMY;  Surgeon: Sebastian Ache, MD;  Location: [facility];  Service: Urology;  Laterality: Left;   wisdom teeth extractions      Social History   Socioeconomic History   Marital status: Single    Spouse name: Not on file   Number of children: Not on file   Years of education: Not on file   Highest education level: Not on file  Occupational History   Not on file  Tobacco Use   Smoking status: Never   Smokeless tobacco: Never  Vaping Use   Vaping Use: Former  Substance and Sexual Activity   Alcohol use: Yes    Alcohol/week: 2.0 - 3.0 standard drinks    Types: 2 - 3 Standard drinks or equivalent per week    Comment: social   Drug use: No   Sexual activity: Yes    Partners: Male    Birth control/protection: Condom  Other Topics Concern   Not on file  Social History Narrative   Not on file   Social Determinants of Health   Financial Resource Strain: Not on  file  Food Insecurity: Not on file  Transportation Needs: Not on file  Physical Activity: Not on file  Stress: Not on file  Social Connections: Not on file    Family History  Problem Relation Age of Onset   Prostate cancer Paternal Grandfather     Health Maintenance  Topic Date Due   Hepatitis C Screening  Never done   COVID-19 Vaccine (3 - Moderna risk series) 02/20/2020   TETANUS/TDAP  08/21/2029   INFLUENZA VACCINE  Completed   HPV VACCINES  Completed   HIV Screening  Completed   Pneumococcal Vaccine 63-23 Years old  Discontinued     ----------------------------------------------------------------------------------------------------------------------------------------------------------------------------------------------------------------- Physical Exam BP (!) 145/81 (BP Location: Left Arm, Patient Position: Sitting, Cuff Size: Normal)    Pulse 69    Temp (!) 97.2 F (36.2 C)    Ht 5\' 11"  (1.803 m)    Wt 192 lb (87.1 kg)    SpO2 100%    BMI 26.78 kg/m   Physical Exam Constitutional:      Appearance: Normal appearance.  Eyes:     General: No scleral icterus. Cardiovascular:     Rate and Rhythm: Normal rate and regular rhythm.  Pulmonary:     Effort: Pulmonary effort is normal.     Breath sounds: Normal breath sounds.  Musculoskeletal:     Cervical back: Neck supple.  Neurological:     General: No focal deficit present.     Mental Status: He is alert.  Psychiatric:        Mood and Affect: Mood normal.        Behavior: Behavior normal.    ------------------------------------------------------------------------------------------------------------------------------------------------------------------------------------------------------------------- Assessment and Plan  Anxiety The symptoms remain well controlled with Lexapro.  Continue current strength.  Other fatigue Checking labs per orders today.  Orders Placed This Encounter  Procedures   COMPLETE  METABOLIC PANEL WITH GFR   CBC with Differential   TSH   Testosterone   B12     No orders of the defined types were placed in this encounter.   No follow-ups on file.    This visit occurred during the SARS-CoV-2 public health emergency.  Safety protocols were in place, including screening questions prior to the visit, additional usage of staff PPE, and extensive cleaning of exam room while observing appropriate contact time as indicated for disinfecting solutions.

## 2021-06-14 NOTE — Assessment & Plan Note (Signed)
The symptoms remain well controlled with Lexapro.  Continue current strength.

## 2021-06-14 NOTE — Assessment & Plan Note (Signed)
Checking labs per orders today.  Orders Placed This Encounter  Procedures   COMPLETE METABOLIC PANEL WITH GFR   CBC with Differential   TSH   Testosterone   B12

## 2021-06-15 LAB — CBC WITH DIFFERENTIAL/PLATELET
Absolute Monocytes: 486 cells/uL (ref 200–950)
Basophils Absolute: 38 cells/uL (ref 0–200)
Basophils Relative: 0.6 %
Eosinophils Absolute: 128 cells/uL (ref 15–500)
Eosinophils Relative: 2 %
HCT: 45.8 % (ref 38.5–50.0)
Hemoglobin: 15.5 g/dL (ref 13.2–17.1)
Lymphs Abs: 2106 cells/uL (ref 850–3900)
MCH: 29.2 pg (ref 27.0–33.0)
MCHC: 33.8 g/dL (ref 32.0–36.0)
MCV: 86.4 fL (ref 80.0–100.0)
MPV: 11 fL (ref 7.5–12.5)
Monocytes Relative: 7.6 %
Neutro Abs: 3642 cells/uL (ref 1500–7800)
Neutrophils Relative %: 56.9 %
Platelets: 279 10*3/uL (ref 140–400)
RBC: 5.3 10*6/uL (ref 4.20–5.80)
RDW: 11.8 % (ref 11.0–15.0)
Total Lymphocyte: 32.9 %
WBC: 6.4 10*3/uL (ref 3.8–10.8)

## 2021-06-15 LAB — COMPLETE METABOLIC PANEL WITH GFR
AG Ratio: 2 (calc) (ref 1.0–2.5)
ALT: 24 U/L (ref 9–46)
AST: 21 U/L (ref 10–40)
Albumin: 4.7 g/dL (ref 3.6–5.1)
Alkaline phosphatase (APISO): 53 U/L (ref 36–130)
BUN: 13 mg/dL (ref 7–25)
CO2: 28 mmol/L (ref 20–32)
Calcium: 9.9 mg/dL (ref 8.6–10.3)
Chloride: 102 mmol/L (ref 98–110)
Creat: 0.86 mg/dL (ref 0.60–1.24)
Globulin: 2.4 g/dL (calc) (ref 1.9–3.7)
Glucose, Bld: 83 mg/dL (ref 65–99)
Potassium: 4.6 mmol/L (ref 3.5–5.3)
Sodium: 141 mmol/L (ref 135–146)
Total Bilirubin: 0.6 mg/dL (ref 0.2–1.2)
Total Protein: 7.1 g/dL (ref 6.1–8.1)
eGFR: 125 mL/min/{1.73_m2} (ref >=60)

## 2021-06-15 LAB — TESTOSTERONE: Testosterone: 942 ng/dL — ABNORMAL HIGH (ref 250–827)

## 2021-06-15 LAB — VITAMIN B12: Vitamin B-12: 464 pg/mL (ref 200–1100)

## 2021-06-15 LAB — TSH: TSH: 2.91 mIU/L (ref 0.40–4.50)

## 2021-07-30 ENCOUNTER — Ambulatory Visit: Payer: 59 | Admitting: Family Medicine

## 2021-08-31 ENCOUNTER — Other Ambulatory Visit: Payer: Self-pay

## 2021-08-31 ENCOUNTER — Encounter: Payer: Self-pay | Admitting: Family Medicine

## 2021-08-31 ENCOUNTER — Ambulatory Visit: Payer: BC Managed Care – PPO | Admitting: Family Medicine

## 2021-08-31 VITALS — BP 118/76 | HR 70 | Ht 71.0 in | Wt 191.0 lb

## 2021-08-31 DIAGNOSIS — A530 Latent syphilis, unspecified as early or late: Secondary | ICD-10-CM | POA: Diagnosis not present

## 2021-08-31 DIAGNOSIS — Z113 Encounter for screening for infections with a predominantly sexual mode of transmission: Secondary | ICD-10-CM | POA: Diagnosis not present

## 2021-08-31 DIAGNOSIS — Z1159 Encounter for screening for other viral diseases: Secondary | ICD-10-CM | POA: Diagnosis not present

## 2021-08-31 DIAGNOSIS — Z114 Encounter for screening for human immunodeficiency virus [HIV]: Secondary | ICD-10-CM | POA: Diagnosis not present

## 2021-08-31 DIAGNOSIS — Z8279 Family history of other congenital malformations, deformations and chromosomal abnormalities: Secondary | ICD-10-CM

## 2021-08-31 DIAGNOSIS — F419 Anxiety disorder, unspecified: Secondary | ICD-10-CM

## 2021-08-31 MED ORDER — ONDANSETRON 4 MG PO TBDP: 4.0000 mg | ORAL_TABLET | Freq: Three times a day (TID) | ORAL | 6 refills | Status: DC | PRN

## 2021-08-31 MED ORDER — ESCITALOPRAM OXALATE 10 MG PO TABS: ORAL_TABLET | ORAL | 1 refills | Status: DC

## 2021-08-31 NOTE — Assessment & Plan Note (Signed)
He will like to try reducing the Lexapro to 10 mg daily.  Updated prescription sent in.  He does still have hydroxyzine to use as needed. ?

## 2021-08-31 NOTE — Progress Notes (Signed)
?Richard Drake - 26 y.o. male MRN [id number]  Date of birth: 11-Jun-1996 ? ?Subjective ?Chief Complaint  ?Patient presents with  ? Follow-up  ? Medication Refill  ? ? ?HPI ?Richard Drake is a 26 year old male here today for follow-up visit.  Overall he is doing well.  Has history of latent syphilis and would like to have updated STI testing. ? ?Requesting refill of Zofran. ? ?Overall doing well with Lexapro but would like to decrease to 10 mg daily as he feels that he is in a better place in regards to his stress and anxiety.  He has needed hydroxyzine less often. ? ?He also reports that his father was diagnosed with bicuspid aortic valve.  It was recommended by his cardiologist that he and his brother get screened for this.  His brother did have echocardiogram which was negative.  He denies any symptoms related to aortic stenosis. ? ?ROS:  A comprehensive ROS was completed and negative except as noted per HPI ? ?Allergies  ?Allergen Reactions  ? Fluoxetine Other (See Comments)  ? ? ?Past Medical History:  ?Diagnosis Date  ? ADHD (attention deficit hyperactivity disorder)   ? hx. of  ? ? ?Past Surgical History:  ?Procedure Laterality Date  ? ROBOT ASSISTED LAPAROSCOPIC RADICAL PROSTATECTOMY Left 05/15/2015  ? Procedure: ROBOTIC ASSISTED LEFT VARICOCELECTOMY;  Surgeon: Sebastian Ache, MD;  Location: [facility];  Service: Urology;  Laterality: Left;  ? wisdom teeth extractions    ? ? ?Social History  ? ?Socioeconomic History  ? Marital status: Single  ?  Spouse name: Not on file  ? Number of children: Not on file  ? Years of education: Not on file  ? Highest education level: Not on file  ?Occupational History  ? Not on file  ?Tobacco Use  ? Smoking status: Never  ? Smokeless tobacco: Never  ?Vaping Use  ? Vaping Use: Former  ?Substance and Sexual Activity  ? Alcohol use: Yes  ?  Alcohol/week: 2.0 - 3.0 standard drinks  ?  Types: 2 - 3 Standard drinks or equivalent per week  ?  Comment: social  ? Drug use: No  ? Sexual activity: Yes   ?  Partners: Male  ?  Birth control/protection: Condom  ?Other Topics Concern  ? Not on file  ?Social History Narrative  ? Not on file  ? ?Social Determinants of Health  ? ?Financial Resource Strain: Not on file  ?Food Insecurity: Not on file  ?Transportation Needs: Not on file  ?Physical Activity: Not on file  ?Stress: Not on file  ?Social Connections: Not on file  ? ? ?Family History  ?Problem Relation Age of Onset  ? Prostate cancer Paternal Grandfather   ? ? ?Health Maintenance  ?Topic Date Due  ? Hepatitis C Screening  Never done  ? COVID-19 Vaccine (3 - Moderna risk series) 02/20/2020  ? TETANUS/TDAP  08/21/2029  ? INFLUENZA VACCINE  Completed  ? HPV VACCINES  Completed  ? HIV Screening  Completed  ? ? ? ?----------------------------------------------------------------------------------------------------------------------------------------------------------------------------------------------------------------- ?Physical Exam ?BP 118/76 (BP Location: Right Arm, Patient Position: Sitting, Cuff Size: Large)   Pulse 70   Ht 5\' 11"  (1.803 m)   Wt 191 lb (86.6 kg)   SpO2 98%   BMI 26.64 kg/m?  ? ?Physical Exam ?Constitutional:   ?   Appearance: Normal appearance.  ?Eyes:  ?   General: No scleral icterus. ?Cardiovascular:  ?   Rate and Rhythm: Normal rate and regular rhythm.  ?Pulmonary:  ?  Effort: Pulmonary effort is normal.  ?   Breath sounds: Normal breath sounds.  ?Musculoskeletal:  ?   Cervical back: Neck supple.  ?Neurological:  ?   General: No focal deficit present.  ?   Mental Status: He is alert.  ?Psychiatric:     ?   Mood and Affect: Mood normal.     ?   Behavior: Behavior normal.  ? ? ?------------------------------------------------------------------------------------------------------------------------------------------------------------------------------------------------------------------- ?Assessment and Plan ? ?Anxiety ?He will like to try reducing the Lexapro to 10 mg daily.  Updated  prescription sent in.  He does still have hydroxyzine to use as needed. ? ?Screening for STD (sexually transmitted disease) ?Screening per lab orders. ? ?Family history of bicuspid aortic valve ?No murmur or symptoms at this time.  Echocardiogram ordered. ? ? ?Meds ordered this encounter  ?Medications  ? ondansetron (ZOFRAN ODT) 4 MG disintegrating tablet  ?  Sig: Take 1 tablet (4 mg total) by mouth every 8 (eight) hours as needed for nausea or vomiting.  ?  Dispense:  20 tablet  ?  Refill:  6  ? escitalopram (LEXAPRO) 10 MG tablet  ?  Sig: Take 10mg  PO daily  ?  Dispense:  90 tablet  ?  Refill:  1  ? ? ?No follow-ups on file. ? ? ? ?This visit occurred during the SARS-CoV-2 public health emergency.  Safety protocols were in place, including screening questions prior to the visit, additional usage of staff PPE, and extensive cleaning of exam room while observing appropriate contact time as indicated for disinfecting solutions.  ? ?

## 2021-08-31 NOTE — Assessment & Plan Note (Addendum)
No murmur or symptoms at this time.  Echocardiogram ordered. ?

## 2021-08-31 NOTE — Assessment & Plan Note (Addendum)
Screening per lab orders. ?

## 2021-09-03 ENCOUNTER — Ambulatory Visit (HOSPITAL_COMMUNITY): Payer: BC Managed Care – PPO | Attending: Cardiology

## 2021-09-03 ENCOUNTER — Other Ambulatory Visit: Payer: Self-pay

## 2021-09-03 DIAGNOSIS — Z8279 Family history of other congenital malformations, deformations and chromosomal abnormalities: Secondary | ICD-10-CM

## 2021-09-03 DIAGNOSIS — Z8249 Family history of ischemic heart disease and other diseases of the circulatory system: Secondary | ICD-10-CM | POA: Insufficient documentation

## 2021-09-03 LAB — ECHOCARDIOGRAM COMPLETE
Area-P 1/2: 3.26 cm2
S' Lateral: 3.2 cm

## 2021-09-05 LAB — HEPATITIS C ANTIBODY
Hepatitis C Ab: NONREACTIVE
SIGNAL TO CUT-OFF: 0.05 (ref <1.00)

## 2021-09-05 LAB — CHLAMYDIA/NEISSERIA GONORRHOEAE RNA,TMA,UROGENTIAL
C. trachomatis RNA, TMA: NOT DETECTED
N. gonorrhoeae RNA, TMA: NOT DETECTED

## 2021-09-05 LAB — HIV ANTIBODY (ROUTINE TESTING W REFLEX): HIV 1&2 Ab, 4th Generation: NONREACTIVE

## 2021-09-05 LAB — RPR: RPR Ser Ql: NONREACTIVE

## 2021-09-05 LAB — TRICHOMONAS VAGINALIS, PROBE AMP: Trichomonas vaginalis RNA: NOT DETECTED

## 2022-04-25 ENCOUNTER — Ambulatory Visit (INDEPENDENT_AMBULATORY_CARE_PROVIDER_SITE_OTHER): Payer: 59 | Admitting: Family Medicine

## 2022-04-25 ENCOUNTER — Encounter: Payer: Self-pay | Admitting: Family Medicine

## 2022-04-25 VITALS — BP 123/78 | HR 68 | Ht 71.0 in | Wt 197.0 lb

## 2022-04-25 DIAGNOSIS — Z114 Encounter for screening for human immunodeficiency virus [HIV]: Secondary | ICD-10-CM | POA: Diagnosis not present

## 2022-04-25 DIAGNOSIS — Z0000 Encounter for general adult medical examination without abnormal findings: Secondary | ICD-10-CM | POA: Diagnosis not present

## 2022-04-25 DIAGNOSIS — Z0184 Encounter for antibody response examination: Secondary | ICD-10-CM | POA: Diagnosis not present

## 2022-04-25 DIAGNOSIS — A530 Latent syphilis, unspecified as early or late: Secondary | ICD-10-CM | POA: Diagnosis not present

## 2022-04-25 NOTE — Assessment & Plan Note (Signed)
Well adult Orders Placed This Encounter  Procedures  . Trichomonas vaginalis, RNA  . Chlamydia/Neisseria Gonorrhoeae RNA,TMA,Urogenital  . COMPLETE METABOLIC PANEL WITH GFR  . CBC with Differential  . Lipid Panel w/reflex Direct LDL  . Hepatitis B Surface AntiBODY  . Varicella zoster antibody, IgG  . Measles/Mumps/Rubella Immunity  . HIV antibody (with reflex)  . Hepatitis C Antibody  . RPR  Screenings: Per lab orders Immunizations: Up-to-date Anticipatory guidance/risk factor reduction: Recommendations per AVS.

## 2022-04-25 NOTE — Patient Instructions (Signed)
Preventive Care 21-24 Years Old, Male Preventive care refers to lifestyle choices and visits with your health care provider that can promote health and wellness. Preventive care visits are also called wellness exams. What can I expect for my preventive care visit? Counseling During your preventive care visit, your health care provider may ask about your: Medical history, including: Past medical problems. Family medical history. Current health, including: Emotional well-being. Home life and relationship well-being. Sexual activity. Lifestyle, including: Alcohol, nicotine or tobacco, and drug use. Access to firearms. Diet, exercise, and sleep habits. Safety issues such as seatbelt and bike helmet use. Sunscreen use. Work and work environment. Physical exam Your health care provider may check your: Height and weight. These may be used to calculate your BMI (body mass index). BMI is a measurement that tells if you are at a healthy weight. Waist circumference. This measures the distance around your waistline. This measurement also tells if you are at a healthy weight and may help predict your risk of certain diseases, such as type 2 diabetes and high blood pressure. Heart rate and blood pressure. Body temperature. Skin for abnormal spots. What immunizations do I need?  Vaccines are usually given at various ages, according to a schedule. Your health care provider will recommend vaccines for you based on your age, medical history, and lifestyle or other factors, such as travel or where you work. What tests do I need? Screening Your health care provider may recommend screening tests for certain conditions. This may include: Lipid and cholesterol levels. Diabetes screening. This is done by checking your blood sugar (glucose) after you have not eaten for a while (fasting). Hepatitis B test. Hepatitis C test. HIV (human immunodeficiency virus) test. STI (sexually transmitted infection)  testing, if you are at risk. Talk with your health care provider about your test results, treatment options, and if necessary, the need for more tests. Follow these instructions at home: Eating and drinking  Eat a healthy diet that includes fresh fruits and vegetables, whole grains, lean protein, and low-fat dairy products. Drink enough fluid to keep your urine pale yellow. Take vitamin and mineral supplements as recommended by your health care provider. Do not drink alcohol if your health care provider tells you not to drink. If you drink alcohol: Limit how much you have to 0-2 drinks a day. Know how much alcohol is in your drink. In the [place] one drink equals one 12 oz bottle of beer (355 mL), one 5 oz glass of wine (148 mL), or one 1 oz glass of hard liquor (44 mL). Lifestyle Brush your teeth every morning and night with fluoride toothpaste. Floss one time each day. Exercise for at least 30 minutes 5 or more days each week. Do not use any products that contain nicotine or tobacco. These products include cigarettes, chewing tobacco, and vaping devices, such as e-cigarettes. If you need help quitting, ask your health care provider. Do not use drugs. If you are sexually active, practice safe sex. Use a condom or other form of protection to prevent STIs. Find healthy ways to manage stress, such as: Meditation, yoga, or listening to music. Journaling. Talking to a trusted person. Spending time with friends and family. Minimize exposure to UV radiation to reduce your risk of skin cancer. Safety Always wear your seat belt while driving or riding in a vehicle. Do not drive: If you have been drinking alcohol. Do not ride with someone who has been drinking. If you have been using any mind-altering substances   or drugs. While texting. When you are tired or distracted. Wear a helmet and other protective equipment during sports activities. If you have firearms in your house, make sure you  follow all gun safety procedures. Seek help if you have been physically or sexually abused. What's next? Go to your health care provider once a year for an annual wellness visit. Ask your health care provider how often you should have your eyes and teeth checked. Stay up to date on all vaccines. This information is not intended to replace advice given to you by your health care provider. Make sure you discuss any questions you have with your health care provider. Document Revised: 12/09/2020 Document Reviewed: 12/09/2020 Elsevier Patient Education  2023 Elsevier Inc[employer]  

## 2022-04-25 NOTE — Progress Notes (Signed)
Richard Drake - 26 y.o. male MRN [id number]  Date of birth: January 19, 1996  Subjective Chief Complaint  Patient presents with   Annual Exam    HPI Richard Drake is a 26 y.o. male here today for annual exam.    Reports that he is doing pretty well at this time.  He has discontinued Lexapro since last visit.  Feels that he no longer needs this.  He is doing well off of this.  He continues to stay quite active.  Feels like diet is pretty good.  Weight has been stable.   He is a non-smoker.  Occasional EtOH use during the week.   He is UTD on immunizations.   He would like to have updated STI screening.  Review of Systems  Constitutional:  Negative for chills, fever, malaise/fatigue and weight loss.  HENT:  Negative for congestion, ear pain and sore throat.   Eyes:  Negative for blurred vision, double vision and pain.  Respiratory:  Negative for cough and shortness of breath.   Cardiovascular:  Negative for chest pain and palpitations.  Gastrointestinal:  Negative for abdominal pain, blood in stool, constipation, heartburn and nausea.  Genitourinary:  Negative for dysuria and urgency.  Musculoskeletal:  Negative for joint pain and myalgias.  Neurological:  Negative for dizziness and headaches.  Endo/Heme/Allergies:  Does not bruise/bleed easily.  Psychiatric/Behavioral:  Negative for depression. The patient is not nervous/anxious and does not have insomnia.     Allergies  Allergen Reactions   Fluoxetine Other (See Comments)    Past Medical History:  Diagnosis Date   ADHD (attention deficit hyperactivity disorder)    hx. of    Past Surgical History:  Procedure Laterality Date   ROBOT ASSISTED LAPAROSCOPIC RADICAL PROSTATECTOMY Left 05/15/2015   Procedure: ROBOTIC ASSISTED LEFT VARICOCELECTOMY;  Surgeon: Alexis Frock, MD;  Location: [facility];  Service: Urology;  Laterality: Left;   wisdom teeth extractions      Social History   Socioeconomic History   Marital status:  Single    Spouse name: Not on file   Number of children: Not on file   Years of education: Not on file   Highest education level: Not on file  Occupational History   Not on file  Tobacco Use   Smoking status: Never   Smokeless tobacco: Never  Vaping Use   Vaping Use: Former  Substance and Sexual Activity   Alcohol use: Yes    Alcohol/week: 2.0 - 3.0 standard drinks of alcohol    Types: 2 - 3 Standard drinks or equivalent per week    Comment: social   Drug use: No   Sexual activity: Yes    Partners: Male    Birth control/protection: Condom  Other Topics Concern   Not on file  Social History Narrative   Not on file   Social Determinants of Health   Financial Resource Strain: Not on file  Food Insecurity: Not on file  Transportation Needs: Not on file  Physical Activity: Not on file  Stress: Not on file  Social Connections: Not on file    Family History  Problem Relation Age of Onset   Prostate cancer Paternal Grandfather     Health Maintenance  Topic Date Due   COVID-19 Vaccine (3 - Moderna risk series) 07/28/2022 (Originally 02/20/2020)   TETANUS/TDAP  08/21/2029   INFLUENZA VACCINE  Completed   HPV VACCINES  Completed   Hepatitis C Screening  Completed   HIV Screening  Completed     -----------------------------------------------------------------------------------------------------------------------------------------------------------------------------------------------------------------  Physical Exam BP 123/78 (BP Location: Left Arm, Patient Position: Sitting, Cuff Size: Normal)   Pulse 68   Ht 5\' 11"  (1.803 m)   Wt 197 lb (89.4 kg)   SpO2 98%   BMI 27.48 kg/m   Physical Exam Constitutional:      General: He is not in acute distress. HENT:     Head: Normocephalic and atraumatic.     Right Ear: Tympanic membrane and external ear normal.     Left Ear: Tympanic membrane and external ear normal.  Eyes:     General: No scleral icterus. Neck:      Thyroid: No thyromegaly.  Cardiovascular:     Rate and Rhythm: Normal rate and regular rhythm.     Heart sounds: Normal heart sounds.  Pulmonary:     Effort: Pulmonary effort is normal.     Breath sounds: Normal breath sounds.  Abdominal:     General: Bowel sounds are normal. There is no distension.     Palpations: Abdomen is soft.     Tenderness: There is no abdominal tenderness. There is no guarding.  Musculoskeletal:     Cervical back: Normal range of motion.  Lymphadenopathy:     Cervical: No cervical adenopathy.  Skin:    General: Skin is warm and dry.     Findings: No rash.  Neurological:     Mental Status: He is alert and oriented to person, place, and time.     Cranial Nerves: No cranial nerve deficit.     Motor: No abnormal muscle tone.  Psychiatric:        Mood and Affect: Mood normal.        Behavior: Behavior normal.     ------------------------------------------------------------------------------------------------------------------------------------------------------------------------------------------------------------------- Assessment and Plan  Well adult exam Well adult Orders Placed This Encounter  Procedures   Trichomonas vaginalis, RNA   Chlamydia/Neisseria Gonorrhoeae RNA,TMA,Urogenital   COMPLETE METABOLIC PANEL WITH GFR   CBC with Differential   Lipid Panel w/reflex Direct LDL   Hepatitis B Surface AntiBODY   Varicella zoster antibody, IgG   Measles/Mumps/Rubella Immunity   HIV antibody (with reflex)   Hepatitis C Antibody   RPR  Screenings: Per lab orders Immunizations: Up-to-date Anticipatory guidance/risk factor reduction: Recommendations per AVS.   No orders of the defined types were placed in this encounter.   No follow-ups on file.    This visit occurred during the SARS-CoV-2 public health emergency.  Safety protocols were in place, including screening questions prior to the visit, additional usage of staff PPE, and extensive  cleaning of exam room while observing appropriate contact time as indicated for disinfecting solutions.

## 2022-04-27 LAB — CBC WITH DIFFERENTIAL/PLATELET
Absolute Monocytes: 605 cells/uL (ref 200–950)
Basophils Absolute: 43 cells/uL (ref 0–200)
Basophils Relative: 0.6 %
Eosinophils Absolute: 202 cells/uL (ref 15–500)
Eosinophils Relative: 2.8 %
HCT: 43.8 % (ref 38.5–50.0)
Hemoglobin: 15.2 g/dL (ref 13.2–17.1)
Lymphs Abs: 1966 cells/uL (ref 850–3900)
MCH: 29.7 pg (ref 27.0–33.0)
MCHC: 34.7 g/dL (ref 32.0–36.0)
MCV: 85.7 fL (ref 80.0–100.0)
MPV: 11.3 fL (ref 7.5–12.5)
Monocytes Relative: 8.4 %
Neutro Abs: 4385 cells/uL (ref 1500–7800)
Neutrophils Relative %: 60.9 %
Platelets: 304 10*3/uL (ref 140–400)
RBC: 5.11 10*6/uL (ref 4.20–5.80)
RDW: 12.1 % (ref 11.0–15.0)
Total Lymphocyte: 27.3 %
WBC: 7.2 10*3/uL (ref 3.8–10.8)

## 2022-04-27 LAB — COMPLETE METABOLIC PANEL WITH GFR
AG Ratio: 1.9 (calc) (ref 1.0–2.5)
ALT: 20 U/L (ref 9–46)
AST: 19 U/L (ref 10–40)
Albumin: 4.6 g/dL (ref 3.6–5.1)
Alkaline phosphatase (APISO): 61 U/L (ref 36–130)
BUN: 18 mg/dL (ref 7–25)
CO2: 23 mmol/L (ref 20–32)
Calcium: 9.4 mg/dL (ref 8.6–10.3)
Chloride: 105 mmol/L (ref 98–110)
Creat: 0.71 mg/dL (ref 0.60–1.24)
Globulin: 2.4 g/dL (calc) (ref 1.9–3.7)
Glucose, Bld: 82 mg/dL (ref 65–99)
Potassium: 4.5 mmol/L (ref 3.5–5.3)
Sodium: 139 mmol/L (ref 135–146)
Total Bilirubin: 0.4 mg/dL (ref 0.2–1.2)
Total Protein: 7 g/dL (ref 6.1–8.1)
eGFR: 131 mL/min/{1.73_m2} (ref >=60)

## 2022-04-27 LAB — CHLAMYDIA/NEISSERIA GONORRHOEAE RNA,TMA,UROGENTIAL
C. trachomatis RNA, TMA: NOT DETECTED
N. gonorrhoeae RNA, TMA: NOT DETECTED

## 2022-04-27 LAB — LIPID PANEL W/REFLEX DIRECT LDL
Cholesterol: 115 mg/dL (ref <200)
HDL: 58 mg/dL (ref >=40)
LDL Cholesterol (Calc): 36 mg/dL (calc)
Non-HDL Cholesterol (Calc): 57 mg/dL (calc) (ref <130)
Total CHOL/HDL Ratio: 2 (calc) (ref <5.0)
Triglycerides: 126 mg/dL (ref <150)

## 2022-04-27 LAB — HIV ANTIBODY (ROUTINE TESTING W REFLEX): HIV 1&2 Ab, 4th Generation: NONREACTIVE

## 2022-04-27 LAB — TRICHOMONAS VAGINALIS, PROBE AMP: Trichomonas vaginalis RNA: NOT DETECTED

## 2022-04-27 LAB — VARICELLA ZOSTER ANTIBODY, IGG: Varicella IgG: 545.8 index

## 2022-04-27 LAB — MEASLES/MUMPS/RUBELLA IMMUNITY
Mumps IgG: <9 AU/mL — ABNORMAL LOW
Rubella: <0.9 Index — ABNORMAL LOW
Rubeola IgG: 124 AU/mL

## 2022-04-27 LAB — HEPATITIS B SURFACE ANTIBODY,QUALITATIVE: Hep B S Ab: REACTIVE — AB

## 2022-04-27 LAB — RPR: RPR Ser Ql: NONREACTIVE

## 2022-04-27 LAB — HEPATITIS C ANTIBODY: Hepatitis C Ab: NONREACTIVE

## 2022-05-09 ENCOUNTER — Encounter: Payer: Self-pay | Admitting: Family Medicine

## 2022-05-10 NOTE — Telephone Encounter (Signed)
He can just drop this off.

## 2022-05-18 ENCOUNTER — Ambulatory Visit (INDEPENDENT_AMBULATORY_CARE_PROVIDER_SITE_OTHER): Payer: 59 | Admitting: Family Medicine

## 2022-05-18 DIAGNOSIS — Z0000 Encounter for general adult medical examination without abnormal findings: Secondary | ICD-10-CM

## 2022-05-18 NOTE — Progress Notes (Signed)
Medical screening examination/treatment was performed by qualified clinical staff member and as supervising physician I was immediately available for consultation/collaboration. I have reviewed documentation and agree with assessment and plan.  Derrian Poli, DO  

## 2022-07-14 ENCOUNTER — Encounter: Payer: Self-pay | Admitting: Family Medicine

## 2022-10-28 ENCOUNTER — Encounter: Payer: Self-pay | Admitting: Family Medicine

## 2022-10-28 ENCOUNTER — Ambulatory Visit (INDEPENDENT_AMBULATORY_CARE_PROVIDER_SITE_OTHER): Payer: Managed Care, Other (non HMO) | Admitting: Family Medicine

## 2022-10-28 VITALS — BP 126/91 | HR 74 | Ht 71.0 in | Wt 202.0 lb

## 2022-10-28 DIAGNOSIS — F411 Generalized anxiety disorder: Secondary | ICD-10-CM | POA: Diagnosis not present

## 2022-10-28 DIAGNOSIS — F419 Anxiety disorder, unspecified: Secondary | ICD-10-CM

## 2022-10-28 DIAGNOSIS — R41840 Attention and concentration deficit: Secondary | ICD-10-CM

## 2022-10-28 DIAGNOSIS — F909 Attention-deficit hyperactivity disorder, unspecified type: Secondary | ICD-10-CM

## 2022-10-28 DIAGNOSIS — Z8619 Personal history of other infectious and parasitic diseases: Secondary | ICD-10-CM

## 2022-10-28 DIAGNOSIS — Z113 Encounter for screening for infections with a predominantly sexual mode of transmission: Secondary | ICD-10-CM

## 2022-10-28 MED ORDER — ONDANSETRON 4 MG PO TBDP: 4.0000 mg | ORAL_TABLET | Freq: Three times a day (TID) | ORAL | 6 refills | Status: AC | PRN

## 2022-10-28 MED ORDER — ESCITALOPRAM OXALATE 10 MG PO TABS: ORAL_TABLET | ORAL | 1 refills | Status: DC

## 2022-10-30 ENCOUNTER — Encounter: Payer: Self-pay | Admitting: Family Medicine

## 2022-10-30 LAB — CHLAMYDIA/NEISSERIA GONORRHOEAE RNA,TMA,UROGENTIAL
C. trachomatis RNA, TMA: NOT DETECTED
N. gonorrhoeae RNA, TMA: NOT DETECTED

## 2022-10-30 LAB — TRICHOMONAS VAGINALIS, PROBE AMP: Trichomonas vaginalis RNA: NOT DETECTED

## 2022-10-30 LAB — HIV ANTIBODY (ROUTINE TESTING W REFLEX): HIV 1&2 Ab, 4th Generation: NONREACTIVE

## 2022-10-30 LAB — RPR: RPR Ser Ql: NONREACTIVE

## 2022-10-30 LAB — HEPATITIS C ANTIBODY: Hepatitis C Ab: NONREACTIVE

## 2022-10-30 NOTE — Assessment & Plan Note (Signed)
Referral placed for further evaluation of ADHD.

## 2022-10-30 NOTE — Assessment & Plan Note (Signed)
STI screening ordered

## 2022-10-30 NOTE — Progress Notes (Signed)
Richard Drake - 27 y.o. male MRN [id number]  Date of birth: March 04, 1996  Subjective Chief Complaint  Patient presents with   Follow-up    Restart lexapro    HPI Richard Drake is a 27 year old male here today for follow-up visit.  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  He is interested in restarting Lexapro at this time.  He has been on this previously and did well in the past.  He does also believe he may have ADHD.  He reports diagnosis of ADHD as a child and was on medication.  He has noticed symptoms that are persisted into adulthood however he has been able to manage for the most part until he started nursing school.  He would like to have evaluation for adult ADHD.    He would also like to have updated STI testing.  He denies any symptoms at this time.  Does have prior history of treated syphilis.  ROS:  A comprehensive ROS was completed and negative except as noted per HPI  Allergies  Allergen Reactions   Fluoxetine Other (See Comments)    Past Medical History:  Diagnosis Date   ADHD (attention deficit hyperactivity disorder)    hx. of    Past Surgical History:  Procedure Laterality Date   ROBOT ASSISTED LAPAROSCOPIC RADICAL PROSTATECTOMY Left 05/15/2015   Procedure: ROBOTIC ASSISTED LEFT VARICOCELECTOMY;  Surgeon: Sebastian Ache, MD;  Location: [facility];  Service: Urology;  Laterality: Left;   wisdom teeth extractions      Social History   Socioeconomic History   Marital status: Single    Spouse name: Not on file   Number of children: Not on file   Years of education: Not on file   Highest education level: Not on file  Occupational History   Not on file  Tobacco Use   Smoking status: Never   Smokeless tobacco: Never  Vaping Use   Vaping Use: Former  Substance and Sexual Activity   Alcohol use: Yes    Alcohol/week: 2.0 - 3.0 standard drinks of alcohol    Types: 2 - 3 Standard drinks or equivalent per week    Comment: social   Drug use:  No   Sexual activity: Yes    Partners: Male    Birth control/protection: Condom  Other Topics Concern   Not on file  Social History Narrative   Not on file   Social Determinants of Health   Financial Resource Strain: Not on file  Food Insecurity: Not on file  Transportation Needs: Not on file  Physical Activity: Not on file  Stress: Not on file  Social Connections: Not on file    Family History  Problem Relation Age of Onset   Prostate cancer Paternal Grandfather     Health Maintenance  Topic Date Due   COVID-19 Vaccine (3 - Moderna risk series) 11/13/2022 (Originally 02/20/2020)   INFLUENZA VACCINE  01/26/2023   DTaP/Tdap/Td (10 - Td or Tdap) 08/21/2029   HPV VACCINES  Completed   Hepatitis C Screening  Completed   HIV Screening  Completed     ----------------------------------------------------------------------------------------------------------------------------------------------------------------------------------------------------------------- Physical Exam BP (!) 126/91   Pulse 74   Ht 5\' 11"  (1.803 m)   Wt 202 lb (91.6 kg)   SpO2 99%   BMI 28.17 kg/m   Physical Exam Constitutional:      Appearance: Normal appearance.  HENT:     Head: Normocephalic and atraumatic.  Eyes:     General: No scleral  icterus. Musculoskeletal:     Cervical back: Neck supple.  Neurological:     Mental Status: He is alert.  Psychiatric:        Mood and Affect: Mood normal.        Behavior: Behavior normal.     ------------------------------------------------------------------------------------------------------------------------------------------------------------------------------------------------------------------- Assessment and Plan  Attention deficit hyperactivity disorder (ADHD) Referral placed for further evaluation of ADHD.  Anxiety He is having increased anxiety.  Adding Lexapro back on. Return in about 3 months (around 01/28/2023) for F/u Anxiety  .   Screening for STD (sexually transmitted disease) STI screening ordered.   Meds ordered this encounter  Medications   escitalopram (LEXAPRO) 10 MG tablet    Sig: Take 1/2 tab po daily x7 days then increase to full tab    Dispense:  90 tablet    Refill:  1   ondansetron (ZOFRAN ODT) 4 MG disintegrating tablet    Sig: Take 1 tablet (4 mg total) by mouth every 8 (eight) hours as needed for nausea or vomiting.    Dispense:  20 tablet    Refill:  6    Return in about 3 months (around 01/28/2023) for F/u Anxiety .    This visit occurred during the SARS-CoV-2 public health emergency.  Safety protocols were in place, including screening questions prior to the visit, additional usage of staff PPE, and extensive cleaning of exam room while observing appropriate contact time as indicated for disinfecting solutions.

## 2022-10-30 NOTE — Assessment & Plan Note (Signed)
He is having increased anxiety.  Adding Lexapro back on. Return in about 3 months (around 01/28/2023) for F/u Anxiety .

## 2022-11-02 ENCOUNTER — Encounter: Payer: Self-pay | Admitting: Family Medicine

## 2022-11-03 NOTE — Telephone Encounter (Signed)
Hey Dr. Jerolyn Drake,  We could try one of the following offices;  [facility]       [address] [place], [place]        [phone]  2.   [facility]       [address]       [place], [place]        Ph: [phone]   3.  [facility]       [address]       [place], [place] [address]       Ph: [phone]   4. [facility]      [address]      [place], [place]      Ph:[phone]  Please let know which one you would like to proceed with.

## 2022-11-15 DIAGNOSIS — F411 Generalized anxiety disorder: Secondary | ICD-10-CM | POA: Insufficient documentation

## 2023-01-30 ENCOUNTER — Ambulatory Visit: Payer: Managed Care, Other (non HMO) | Admitting: Family Medicine

## 2023-02-13 IMAGING — US US ABDOMEN LIMITED
1 series · 8 of 8 positions shown · non-contrast
Comparison: None.

CLINICAL DATA: Palpable area above the right testicle.

EXAM:
ULTRASOUND ABDOMEN LIMITED

[Series 1: us abdomen limited · 8 acquisitions, 8 frames shown]
[im 1/8]
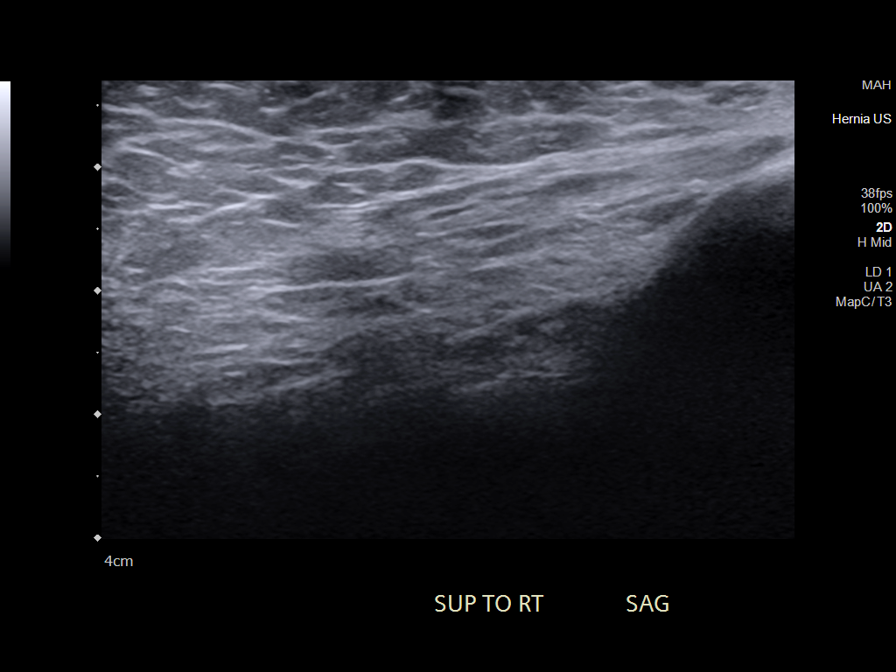
[im 2/8]
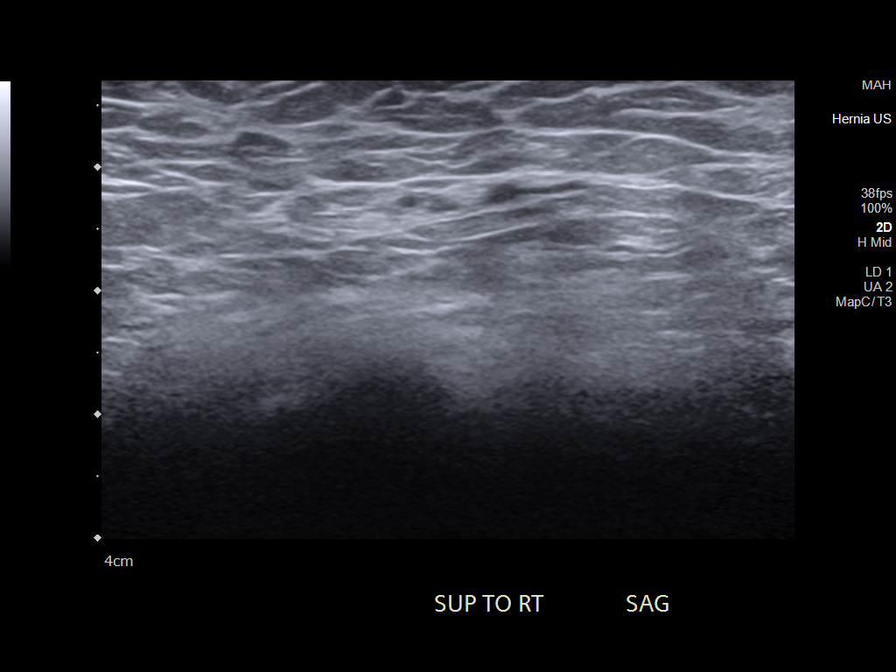
[im 3/8]
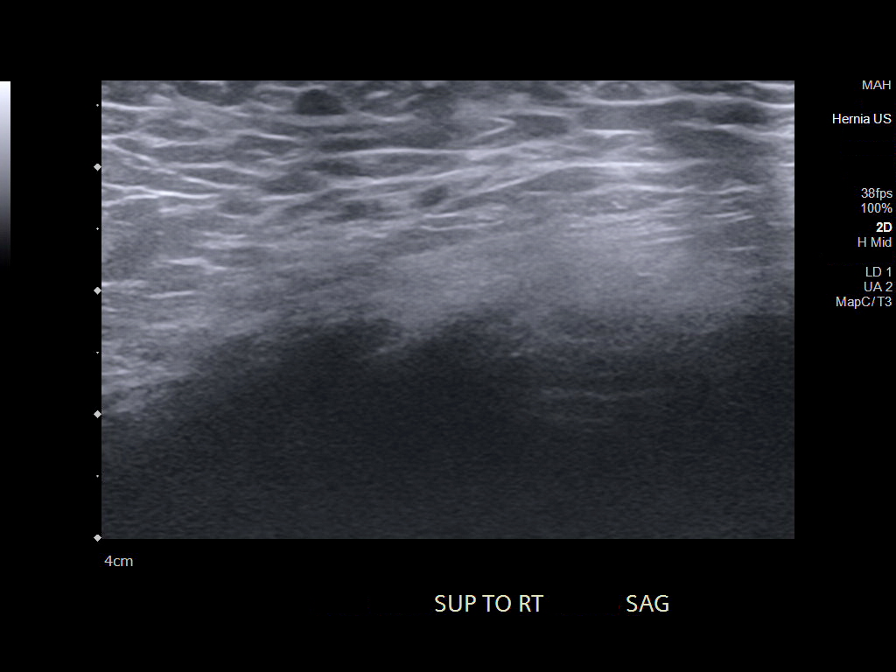
[im 4/8]
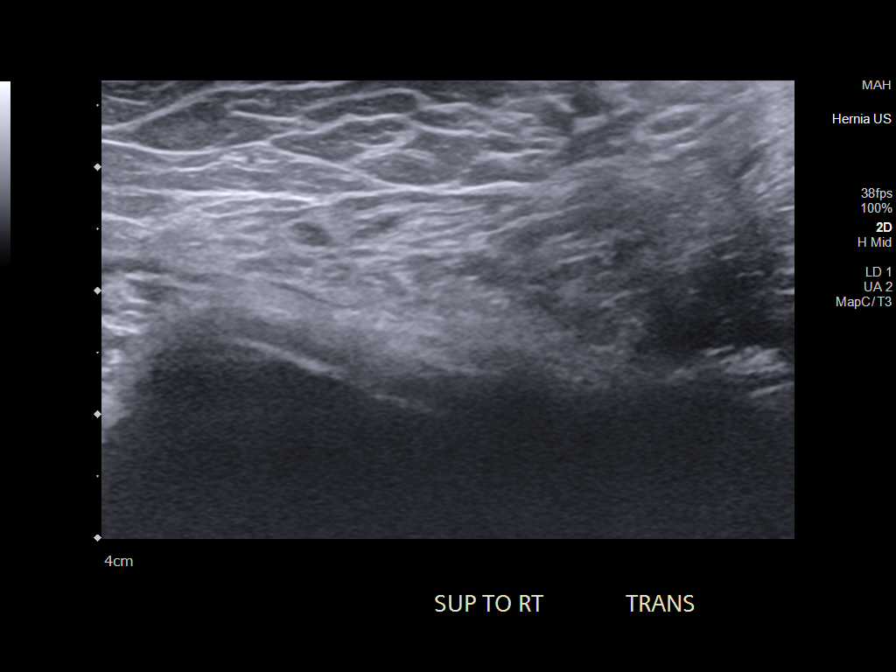
[im 5/8]
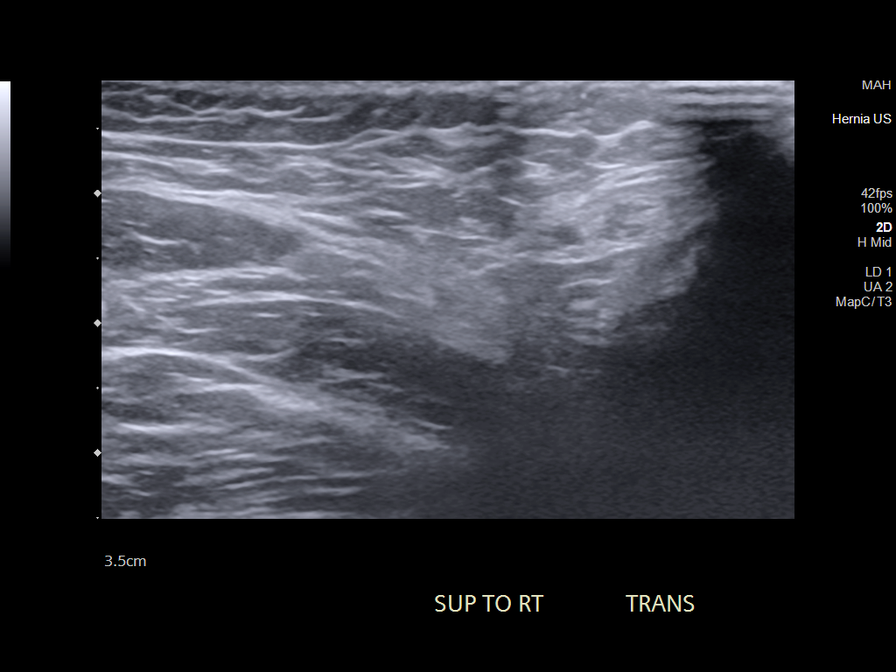
[im 6/8]
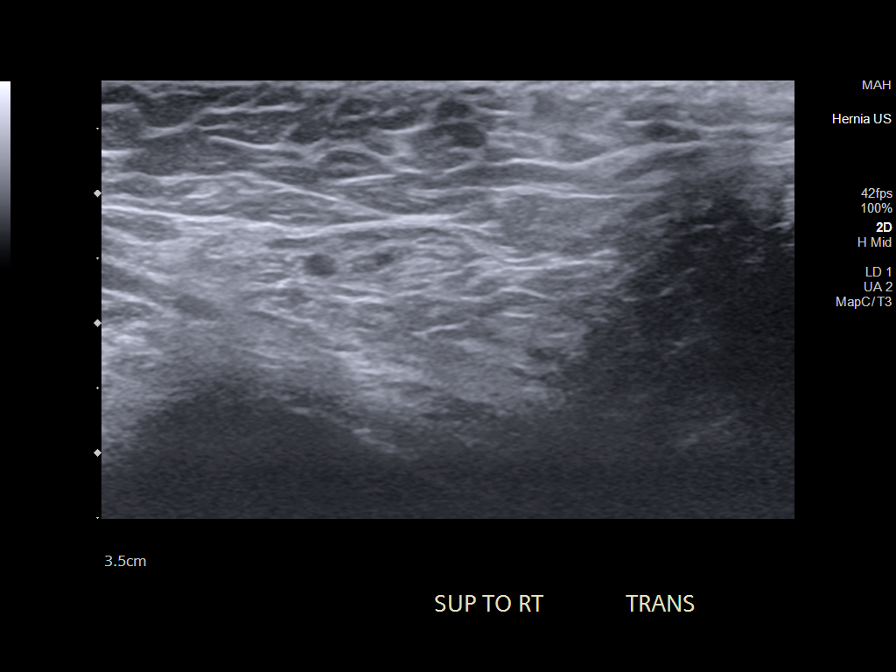
[im 7/8]
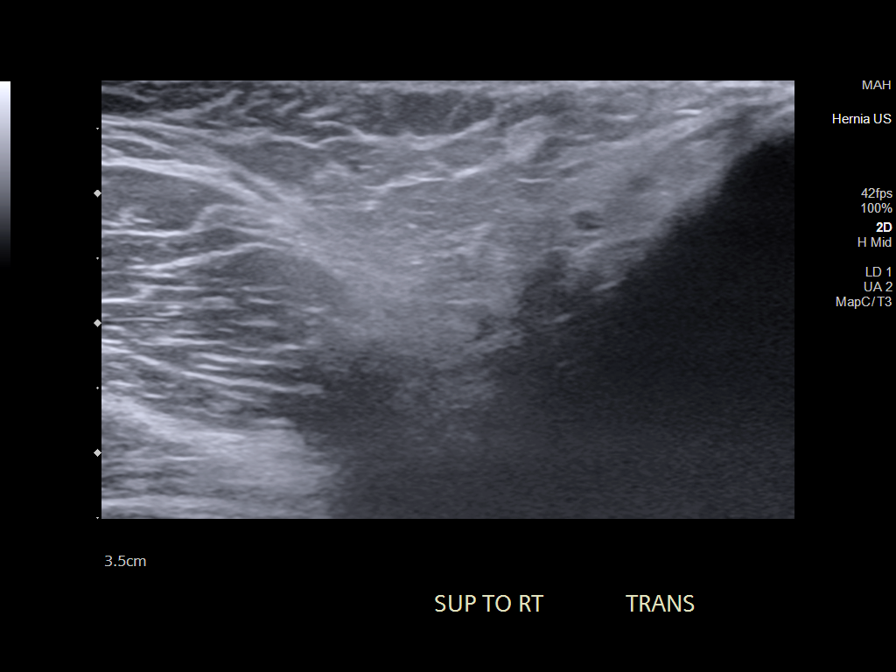
[im 8/8]
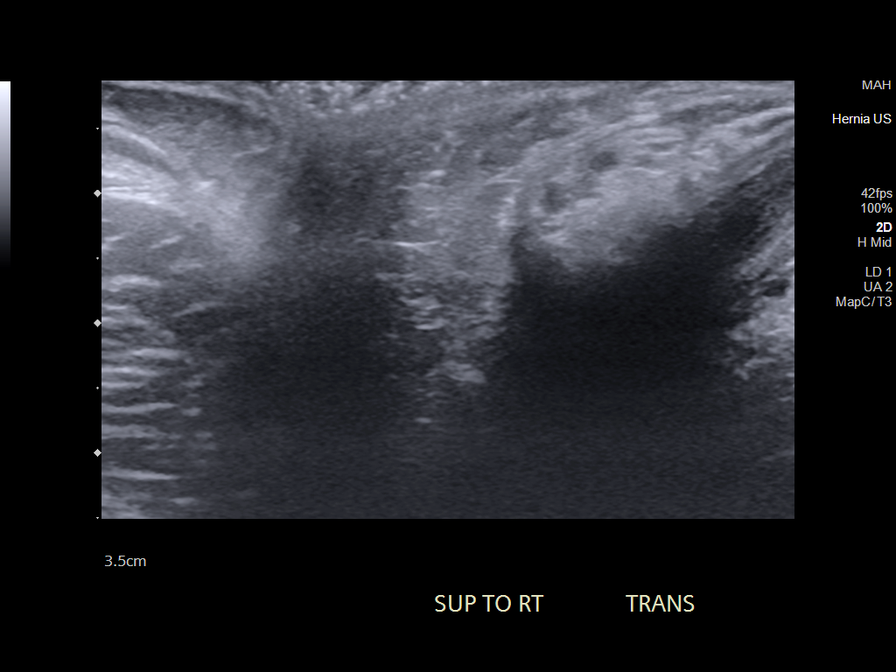

[8 of 8 positions shown; findings below may reference images not displayed]

FINDINGS: Targeted sonographic images of the soft tissues above the right
testicle was performed in the region of the patient's clinical
concern.

No mass, collection, or architectural distortion.
IMPRESSION: Negative.

## 2023-03-10 ENCOUNTER — Encounter: Payer: Self-pay | Admitting: Family Medicine

## 2023-03-10 ENCOUNTER — Other Ambulatory Visit: Payer: Self-pay

## 2023-03-10 ENCOUNTER — Ambulatory Visit: Payer: Managed Care, Other (non HMO) | Admitting: Family Medicine

## 2023-03-10 VITALS — BP 139/87 | HR 89 | Ht 71.0 in | Wt 189.0 lb

## 2023-03-10 DIAGNOSIS — R519 Headache, unspecified: Secondary | ICD-10-CM | POA: Diagnosis not present

## 2023-03-10 DIAGNOSIS — Z23 Encounter for immunization: Secondary | ICD-10-CM | POA: Diagnosis not present

## 2023-03-10 DIAGNOSIS — Z8619 Personal history of other infectious and parasitic diseases: Secondary | ICD-10-CM

## 2023-03-10 MED ORDER — ESCITALOPRAM OXALATE 10 MG PO TABS: ORAL_TABLET | ORAL | 1 refills | Status: DC

## 2023-03-10 NOTE — Patient Instructions (Signed)
Stay well hydrated.  Try adding magnesium glycinate 250-500mg  daily.

## 2023-03-10 NOTE — Progress Notes (Unsigned)
Richard Drake - 27 y.o. male MRN [id number]  Date of birth: [birth year]  Subjective Chief Complaint  Patient presents with  . Headache    Random intermitting last for . Been going on for 2 weeks.    HPI Richard Drake is a 27 y.o. male here today with complaint of headache.  Symptoms started about 2 weeks ago.   Allergies  Allergen Reactions  . Fluoxetine Other (See Comments)    Past Medical History:  Diagnosis Date  . ADHD (attention deficit hyperactivity disorder)    hx. of    Past Surgical History:  Procedure Laterality Date  . ROBOT ASSISTED LAPAROSCOPIC RADICAL PROSTATECTOMY Left 05/15/2015   Procedure: ROBOTIC ASSISTED LEFT VARICOCELECTOMY;  Surgeon: Sebastian Ache, MD;  Location: [facility];  Service: Urology;  Laterality: Left;  . wisdom teeth extractions      Social History   Socioeconomic History  . Marital status: Single    Spouse name: Not on file  . Number of children: Not on file  . Years of education: Not on file  . Highest education level: Not on file  Occupational History  . Not on file  Tobacco Use  . Smoking status: Never  . Smokeless tobacco: Never  Vaping Use  . Vaping status: Former  Substance and Sexual Activity  . Alcohol use: Yes    Alcohol/week: 2.0 - 3.0 standard drinks of alcohol    Types: 2 - 3 Standard drinks or equivalent per week    Comment: social  . Drug use: No  . Sexual activity: Yes    Partners: Male    Birth control/protection: Condom  Other Topics Concern  . Not on file  Social History Narrative  . Not on file   Social Determinants of Health   Financial Resource Strain: Not on file  Food Insecurity: Not on file  Transportation Needs: Not on file  Physical Activity: Not on file  Stress: Not on file  Social Connections: Unknown (11/09/2021)   Received from [facility], [facility]   Social Network   . Social Network: Not on file    Family History  Problem Relation Age of Onset  . Prostate cancer Paternal  Grandfather     Health Maintenance  Topic Date Due  . COVID-19 Vaccine (3 - Moderna risk series) 02/20/2020  . INFLUENZA VACCINE  01/26/2023  . DTaP/Tdap/Td (10 - Td or Tdap) 08/21/2029  . HPV VACCINES  Completed  . Hepatitis C Screening  Completed  . HIV Screening  Completed     ----------------------------------------------------------------------------------------------------------------------------------------------------------------------------------------------------------------- Physical Exam BP 139/87   Pulse 89   Ht 5\' 11"  (1.803 m)   Wt 189 lb (85.7 kg)   SpO2 100%   BMI 26.36 kg/m   Physical Exam  ------------------------------------------------------------------------------------------------------------------------------------------------------------------------------------------------------------------- Assessment and Plan  No problem-specific Assessment & Plan notes found for this encounter.   No orders of the defined types were placed in this encounter.   No follow-ups on file.    This visit occurred during the SARS-CoV-2 public health emergency.  Safety protocols were in place, including screening questions prior to the visit, additional usage of staff PPE, and extensive cleaning of exam room while observing appropriate contact time as indicated for disinfecting solutions.

## 2023-03-13 ENCOUNTER — Encounter: Payer: Self-pay | Admitting: Family Medicine

## 2023-03-13 DIAGNOSIS — R519 Headache, unspecified: Secondary | ICD-10-CM | POA: Insufficient documentation

## 2023-03-13 NOTE — Assessment & Plan Note (Addendum)
He has new onset of headaches with increasing frequency.  No neurological deficits or symptoms associated with this.  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  MRI of the brain ordered.

## 2023-03-15 ENCOUNTER — Other Ambulatory Visit: Payer: Self-pay | Admitting: Family Medicine

## 2023-03-15 DIAGNOSIS — Z0189 Encounter for other specified special examinations: Secondary | ICD-10-CM

## 2023-03-21 ENCOUNTER — Ambulatory Visit: Payer: Managed Care, Other (non HMO)

## 2023-03-21 ENCOUNTER — Ambulatory Visit (INDEPENDENT_AMBULATORY_CARE_PROVIDER_SITE_OTHER): Payer: Managed Care, Other (non HMO)

## 2023-03-21 DIAGNOSIS — Z8619 Personal history of other infectious and parasitic diseases: Secondary | ICD-10-CM

## 2023-03-21 DIAGNOSIS — Z0189 Encounter for other specified special examinations: Secondary | ICD-10-CM

## 2023-03-21 DIAGNOSIS — R519 Headache, unspecified: Secondary | ICD-10-CM | POA: Diagnosis not present

## 2023-04-04 ENCOUNTER — Encounter: Payer: Self-pay | Admitting: Family Medicine

## 2023-04-27 ENCOUNTER — Encounter: Payer: Self-pay | Admitting: Family Medicine

## 2023-04-27 ENCOUNTER — Ambulatory Visit (INDEPENDENT_AMBULATORY_CARE_PROVIDER_SITE_OTHER): Payer: Managed Care, Other (non HMO) | Admitting: Family Medicine

## 2023-04-27 VITALS — BP 123/87 | HR 74 | Ht 71.0 in | Wt 207.0 lb

## 2023-04-27 DIAGNOSIS — Z0000 Encounter for general adult medical examination without abnormal findings: Secondary | ICD-10-CM | POA: Diagnosis not present

## 2023-04-27 DIAGNOSIS — Z13220 Encounter for screening for lipoid disorders: Secondary | ICD-10-CM | POA: Diagnosis not present

## 2023-04-27 NOTE — Progress Notes (Signed)
Richard Drake - 27 y.o. male MRN [id number]  Date of birth: 27-Feb-1996  Subjective Chief Complaint  Patient presents with   Annual Exam    HPI Richard Drake is a 27 y.o. male here today for annual exam.   Reports that he is doing well and denies new concerns.    He remains moderately active and feels that diet is pretty good.   He is a non-smoker.  Rare EtOH use.   Review of Systems  Constitutional:  Negative for chills, fever, malaise/fatigue and weight loss.  HENT:  Negative for congestion, ear pain and sore throat.   Eyes:  Negative for blurred vision, double vision and pain.  Respiratory:  Negative for cough and shortness of breath.   Cardiovascular:  Negative for chest pain and palpitations.  Gastrointestinal:  Negative for abdominal pain, blood in stool, constipation, heartburn and nausea.  Genitourinary:  Negative for dysuria and urgency.  Musculoskeletal:  Negative for joint pain and myalgias.  Neurological:  Negative for dizziness and headaches.  Endo/Heme/Allergies:  Does not bruise/bleed easily.  Psychiatric/Behavioral:  Negative for depression. The patient is not nervous/anxious and does not have insomnia.     Allergies  Allergen Reactions   Fluoxetine Other (See Comments)    Past Medical History:  Diagnosis Date   ADHD (attention deficit hyperactivity disorder)    hx. of    Past Surgical History:  Procedure Laterality Date   ROBOT ASSISTED LAPAROSCOPIC RADICAL PROSTATECTOMY Left 05/15/2015   Procedure: ROBOTIC ASSISTED LEFT VARICOCELECTOMY;  Surgeon: Sebastian Ache, MD;  Location: [facility];  Service: Urology;  Laterality: Left;   wisdom teeth extractions      Social History   Socioeconomic History   Marital status: Single    Spouse name: Not on file   Number of children: Not on file   Years of education: Not on file   Highest education level: Not on file  Occupational History   Not on file  Tobacco Use   Smoking status: Never   Smokeless tobacco:  Never  Vaping Use   Vaping status: Former  Substance and Sexual Activity   Alcohol use: Yes    Alcohol/week: 2.0 - 3.0 standard drinks of alcohol    Types: 2 - 3 Standard drinks or equivalent per week    Comment: social   Drug use: No   Sexual activity: Yes    Partners: Male    Birth control/protection: Condom  Other Topics Concern   Not on file  Social History Narrative   Not on file   Social Determinants of Health   Financial Resource Strain: Not on file  Food Insecurity: Not on file  Transportation Needs: Not on file  Physical Activity: Not on file  Stress: Not on file  Social Connections: Unknown (11/09/2021)   Received from [facility], [facility]   Social Network    Social Network: Not on file    Family History  Problem Relation Age of Onset   Prostate cancer Paternal Grandfather     Health Maintenance  Topic Date Due   COVID-19 Vaccine (3 - Moderna risk series) 05/13/2023 (Originally 02/20/2020)   DTaP/Tdap/Td (10 - Td or Tdap) 08/21/2029   INFLUENZA VACCINE  Completed   HPV VACCINES  Completed   Hepatitis C Screening  Completed   HIV Screening  Completed     ----------------------------------------------------------------------------------------------------------------------------------------------------------------------------------------------------------------- Physical Exam BP 123/87 (BP Location: Left Arm, Patient Position: Sitting, Cuff Size: Normal)   Pulse 74   Ht 5\' 11"  (  1.803 m)   Wt 207 lb (93.9 kg)   SpO2 98%   BMI 28.87 kg/m   Physical Exam Constitutional:      General: He is not in acute distress. HENT:     Head: Normocephalic and atraumatic.     Right Ear: Tympanic membrane and external ear normal.     Left Ear: Tympanic membrane and external ear normal.  Eyes:     General: No scleral icterus. Neck:     Thyroid: No thyromegaly.  Cardiovascular:     Rate and Rhythm: Normal rate and regular rhythm.     Heart sounds: Normal  heart sounds.  Pulmonary:     Effort: Pulmonary effort is normal.     Breath sounds: Normal breath sounds.  Abdominal:     General: Bowel sounds are normal. There is no distension.     Palpations: Abdomen is soft.     Tenderness: There is no abdominal tenderness. There is no guarding.  Musculoskeletal:     Cervical back: Normal range of motion.  Lymphadenopathy:     Cervical: No cervical adenopathy.  Skin:    General: Skin is warm and dry.     Findings: No rash.  Neurological:     Mental Status: He is alert and oriented to person, place, and time.     Cranial Nerves: No cranial nerve deficit.     Motor: No abnormal muscle tone.  Psychiatric:        Mood and Affect: Mood normal.        Behavior: Behavior normal.     ------------------------------------------------------------------------------------------------------------------------------------------------------------------------------------------------------------------- Assessment and Plan  Well adult exam Well adult Orders Placed This Encounter  Procedures   CMP14+EGFR   CBC with Differential/Platelet   Lipid Panel With LDL/HDL Ratio  Screenings: Per lab orders Immunizations: Up-to-date Anticipatory guidance/risk factor reduction: Recommendations per AVS.   No orders of the defined types were placed in this encounter.   No follow-ups on file.    This visit occurred during the SARS-CoV-2 public health emergency.  Safety protocols were in place, including screening questions prior to the visit, additional usage of staff PPE, and extensive cleaning of exam room while observing appropriate contact time as indicated for disinfecting solutions.

## 2023-04-27 NOTE — Patient Instructions (Signed)
Preventive Care 21-25 Years Old, Male Preventive care refers to lifestyle choices and visits with your health care provider that can promote health and wellness. Preventive care visits are also called wellness exams. What can I expect for my preventive care visit? Counseling During your preventive care visit, your health care provider may ask about your: Medical history, including: Past medical problems. Family medical history. Current health, including: Emotional well-being. Home life and relationship well-being. Sexual activity. Lifestyle, including: Alcohol, nicotine or tobacco, and drug use. Access to firearms. Diet, exercise, and sleep habits. Safety issues such as seatbelt and bike helmet use. Sunscreen use. Work and work environment. Physical exam Your health care provider may check your: Height and weight. These may be used to calculate your BMI (body mass index). BMI is a measurement that tells if you are at a healthy weight. Waist circumference. This measures the distance around your waistline. This measurement also tells if you are at a healthy weight and may help predict your risk of certain diseases, such as type 2 diabetes and high blood pressure. Heart rate and blood pressure. Body temperature. Skin for abnormal spots. What immunizations do I need?  Vaccines are usually given at various ages, according to a schedule. Your health care provider will recommend vaccines for you based on your age, medical history, and lifestyle or other factors, such as travel or where you work. What tests do I need? Screening Your health care provider may recommend screening tests for certain conditions. This may include: Lipid and cholesterol levels. Diabetes screening. This is done by checking your blood sugar (glucose) after you have not eaten for a while (fasting). Hepatitis B test. Hepatitis C test. HIV (human immunodeficiency virus) test. STI (sexually transmitted infection)  testing, if you are at risk. Talk with your health care provider about your test results, treatment options, and if necessary, the need for more tests. Follow these instructions at home: Eating and drinking  Eat a healthy diet that includes fresh fruits and vegetables, whole grains, lean protein, and low-fat dairy products. Drink enough fluid to keep your urine pale yellow. Take vitamin and mineral supplements as recommended by your health care provider. Do not drink alcohol if your health care provider tells you not to drink. If you drink alcohol: Limit how much you have to 0-2 drinks a day. Know how much alcohol is in your drink. In the [place] one drink equals one 12 oz bottle of beer (355 mL), one 5 oz glass of wine (148 mL), or one 1 oz glass of hard liquor (44 mL). Lifestyle Brush your teeth every morning and night with fluoride toothpaste. Floss one time each day. Exercise for at least 30 minutes 5 or more days each week. Do not use any products that contain nicotine or tobacco. These products include cigarettes, chewing tobacco, and vaping devices, such as e-cigarettes. If you need help quitting, ask your health care provider. Do not use drugs. If you are sexually active, practice safe sex. Use a condom or other form of protection to prevent STIs. Find healthy ways to manage stress, such as: Meditation, yoga, or listening to music. Journaling. Talking to a trusted person. Spending time with friends and family. Minimize exposure to UV radiation to reduce your risk of skin cancer. Safety Always wear your seat belt while driving or riding in a vehicle. Do not drive: If you have been drinking alcohol. Do not ride with someone who has been drinking. If you have been using any mind-altering substances   or drugs. While texting. When you are tired or distracted. Wear a helmet and other protective equipment during sports activities. If you have firearms in your house, make sure you  follow all gun safety procedures. Seek help if you have been physically or sexually abused. What's next? Go to your health care provider once a year for an annual wellness visit. Ask your health care provider how often you should have your eyes and teeth checked. Stay up to date on all vaccines. This information is not intended to replace advice given to you by your health care provider. Make sure you discuss any questions you have with your health care provider. Document Revised: 12/09/2020 Document Reviewed: 12/09/2020 Elsevier Patient Education  2024 Elsevier Inc.  

## 2023-04-27 NOTE — Assessment & Plan Note (Signed)
Well adult Orders Placed This Encounter  Procedures   CMP14+EGFR   CBC with Differential/Platelet   Lipid Panel With LDL/HDL Ratio  Screenings: Per lab orders Immunizations: Up-to-date Anticipatory guidance/risk factor reduction: Recommendations per AVS.

## 2023-04-28 LAB — CMP14+EGFR
ALT: 33 [IU]/L (ref 0–44)
AST: 23 [IU]/L (ref 0–40)
Albumin: 4.9 g/dL (ref 4.3–5.2)
Alkaline Phosphatase: 67 [IU]/L (ref 44–121)
BUN/Creatinine Ratio: 16 (ref 9–20)
BUN: 13 mg/dL (ref 6–20)
Bilirubin Total: 0.5 mg/dL (ref 0.0–1.2)
CO2: 25 mmol/L (ref 20–29)
Calcium: 9.8 mg/dL (ref 8.7–10.2)
Chloride: 101 mmol/L (ref 96–106)
Creatinine, Ser: 0.83 mg/dL (ref 0.76–1.27)
Globulin, Total: 2.1 g/dL (ref 1.5–4.5)
Glucose: 86 mg/dL (ref 70–99)
Potassium: 4.3 mmol/L (ref 3.5–5.2)
Sodium: 141 mmol/L (ref 134–144)
Total Protein: 7 g/dL (ref 6.0–8.5)
eGFR: 125 mL/min/{1.73_m2} (ref >59)

## 2023-04-28 LAB — CBC WITH DIFFERENTIAL/PLATELET
Basophils Absolute: 0.1 10*3/uL (ref 0.0–0.2)
Basos: 1 %
EOS (ABSOLUTE): 0.2 10*3/uL (ref 0.0–0.4)
Eos: 3 %
Hematocrit: 45.4 % (ref 37.5–51.0)
Hemoglobin: 15.3 g/dL (ref 13.0–17.7)
Immature Grans (Abs): 0 10*3/uL (ref 0.0–0.1)
Immature Granulocytes: 0 %
Lymphocytes Absolute: 1.7 10*3/uL (ref 0.7–3.1)
Lymphs: 29 %
MCH: 29.1 pg (ref 26.6–33.0)
MCHC: 33.7 g/dL (ref 31.5–35.7)
MCV: 87 fL (ref 79–97)
Monocytes Absolute: 0.6 10*3/uL (ref 0.1–0.9)
Monocytes: 10 %
Neutrophils Absolute: 3.3 10*3/uL (ref 1.4–7.0)
Neutrophils: 57 %
Platelets: 274 10*3/uL (ref 150–450)
RBC: 5.25 x10E6/uL (ref 4.14–5.80)
RDW: 12.2 % (ref 11.6–15.4)
WBC: 5.9 10*3/uL (ref 3.4–10.8)

## 2023-04-28 LAB — LIPID PANEL WITH LDL/HDL RATIO
Cholesterol, Total: 135 mg/dL (ref 100–199)
HDL: 54 mg/dL (ref >39)
LDL Chol Calc (NIH): 61 mg/dL (ref 0–99)
LDL/HDL Ratio: 1.1 ratio (ref 0.0–3.6)
Triglycerides: 110 mg/dL (ref 0–149)
VLDL Cholesterol Cal: 20 mg/dL (ref 5–40)

## 2023-06-07 ENCOUNTER — Other Ambulatory Visit: Payer: Self-pay | Admitting: Medical Genetics

## 2024-01-27 DIAGNOSIS — J029 Acute pharyngitis, unspecified: Secondary | ICD-10-CM | POA: Diagnosis not present

## 2024-01-29 ENCOUNTER — Ambulatory Visit: Payer: Self-pay

## 2024-01-29 NOTE — Telephone Encounter (Signed)
 FYI Only or Action Required?: FYI only for provider.  Patient was last seen in primary care on 04/27/2023 by [name], DO.  Called Nurse Triage reporting Sore Throat.  Symptoms began several days ago.  Interventions attempted: OTC medications: throat spray.  Symptoms are: unchanged.  Triage Disposition: See PCP When Office is Open (Within 3 Days)  Patient/caregiver understands and will follow disposition?: Yes           Copied from CRM #[id number]. Topic: Clinical - Red Word Triage >> Jan 29, 2024 11:24 AM Kevelyn M wrote: Red Word that prompted transfer to Nurse Triage: Sore throat since 01/25/2024. Reason for Disposition  [1] Sore throat is the only symptom AND [2] present > 48 hours  Answer Assessment - Initial Assessment Questions 1. ONSET: When did the throat start hurting? (Hours or days ago)      Thursday 2. SEVERITY: How bad is the sore throat? (Scale 1-10; mild, moderate or severe)     Hurst to swallow 3. STREP EXPOSURE: Has there been any exposure to strep within the past week? If Yes, ask: What type of contact occurred?      Strep test was negative 4.  VIRAL SYMPTOMS: Are there any symptoms of a cold, such as a runny nose, cough, hoarse voice or red eyes?      no 5. FEVER: Do you have a fever? If Yes, ask: What is your temperature, how was it measured, and when did it start?     no 6. PUS ON THE TONSILS: Is there pus on the tonsils in the back of your throat?     Left tonsil swollen 7. OTHER SYMPTOMS: Do you have any other symptoms? (e.g., difficulty breathing, headache, rash)     No  Protocols used: Sore Throat-A-AH

## 2024-01-29 NOTE — Progress Notes (Deleted)
        Established patient visit   History of Present Illness   Discussed the use of AI scribe software for clinical note transcription with the patient, who gave verbal consent to proceed.  History of Present Illness            Physical Exam   Physical Exam  Assessment & Plan   Assessment and Plan               Follow up   No follow-ups on file.  __________________________________ Richard FREDRIK Palin, DNP, APRN, FNP-BC Primary Care and Sports Medicine [facility] [place]

## 2024-01-30 ENCOUNTER — Ambulatory Visit: Admitting: Medical-Surgical

## 2024-01-30 DIAGNOSIS — J029 Acute pharyngitis, unspecified: Secondary | ICD-10-CM

## 2024-01-30 NOTE — Telephone Encounter (Signed)
 Patient had appt scheduled with Zada Palin , NP for tomorrow but appt was cancelled by patient.

## 2024-04-05 ENCOUNTER — Other Ambulatory Visit: Payer: Self-pay | Admitting: Medical Genetics

## 2024-04-05 DIAGNOSIS — Z006 Encounter for examination for normal comparison and control in clinical research program: Secondary | ICD-10-CM

## 2024-04-13 ENCOUNTER — Other Ambulatory Visit (HOSPITAL_BASED_OUTPATIENT_CLINIC_OR_DEPARTMENT_OTHER): Payer: Self-pay

## 2024-04-13 MED ORDER — FLUZONE 0.5 ML IM SUSY
0.5000 mL | PREFILLED_SYRINGE | Freq: Once | INTRAMUSCULAR | 0 refills | Status: AC
  Filled 2024-04-13: qty 0.5, 1d supply, fill #0

## 2024-04-30 ENCOUNTER — Encounter: Payer: Self-pay | Admitting: Family Medicine

## 2024-05-02 ENCOUNTER — Encounter: Payer: Self-pay | Admitting: Family Medicine

## 2024-05-02 ENCOUNTER — Ambulatory Visit (INDEPENDENT_AMBULATORY_CARE_PROVIDER_SITE_OTHER): Payer: Self-pay | Admitting: Family Medicine

## 2024-05-02 VITALS — BP 133/86 | HR 79 | Ht 71.0 in | Wt 217.7 lb

## 2024-05-02 DIAGNOSIS — A530 Latent syphilis, unspecified as early or late: Secondary | ICD-10-CM

## 2024-05-02 DIAGNOSIS — Z113 Encounter for screening for infections with a predominantly sexual mode of transmission: Secondary | ICD-10-CM | POA: Diagnosis not present

## 2024-05-02 DIAGNOSIS — Z0000 Encounter for general adult medical examination without abnormal findings: Secondary | ICD-10-CM | POA: Diagnosis not present

## 2024-05-02 DIAGNOSIS — Z13220 Encounter for screening for lipoid disorders: Secondary | ICD-10-CM | POA: Diagnosis not present

## 2024-05-02 NOTE — Progress Notes (Signed)
 Richard Drake - 28 y.o. male MRN [id number]  Date of birth: 07-07-1995  Subjective Chief Complaint  Patient presents with   Annual Exam    HPI Richard Drake is a 28 y.o. male here today for annual exam.   He reports that he is doing well. Some occasional loose stool.  This is random.  No blood in stool.  May be related to energy drinks.   He remains moderately active.  Feels that diet is pretty good.  No nicotine products.  Rare EtOH.    Review of Systems  Constitutional:  Negative for chills, fever, malaise/fatigue and weight loss.  HENT:  Negative for congestion, ear pain and sore throat.   Eyes:  Negative for blurred vision, double vision and pain.  Respiratory:  Negative for cough and shortness of breath.   Cardiovascular:  Negative for chest pain and palpitations.  Gastrointestinal:  Negative for abdominal pain, blood in stool, constipation, heartburn and nausea.  Genitourinary:  Negative for dysuria and urgency.  Musculoskeletal:  Negative for joint pain and myalgias.  Neurological:  Negative for dizziness and headaches.  Endo/Heme/Allergies:  Does not bruise/bleed easily.  Psychiatric/Behavioral:  Negative for depression. The patient is not nervous/anxious and does not have insomnia.     Allergies  Allergen Reactions   Fluoxetine Other (See Comments)    Past Medical History:  Diagnosis Date   ADHD (attention deficit hyperactivity disorder)    hx. of    Past Surgical History:  Procedure Laterality Date   ROBOT ASSISTED LAPAROSCOPIC RADICAL PROSTATECTOMY Left 05/15/2015   Procedure: ROBOTIC ASSISTED LEFT VARICOCELECTOMY;  Surgeon: Ricardo Likens, MD;  Location: [facility];  Service: Urology;  Laterality: Left;   wisdom teeth extractions      Social History   Socioeconomic History   Marital status: Single    Spouse name: Not on file   Number of children: Not on file   Years of education: Not on file   Highest education level: Not on file  Occupational History    Not on file  Tobacco Use   Smoking status: Never   Smokeless tobacco: Never  Vaping Use   Vaping status: Former  Substance and Sexual Activity   Alcohol use: Yes    Alcohol/week: 2.0 - 3.0 standard drinks of alcohol    Types: 2 - 3 Standard drinks or equivalent per week    Comment: social   Drug use: No   Sexual activity: Yes    Partners: Male    Birth control/protection: Condom  Other Topics Concern   Not on file  Social History Narrative   Not on file   Social Drivers of Health   Financial Resource Strain: Not on file  Food Insecurity: Not on file  Transportation Needs: Not on file  Physical Activity: Not on file  Stress: Not on file  Social Connections: Unknown (11/09/2021)   Received from [facility]   Social Network    Social Network: Not on file    Family History  Problem Relation Age of Onset   Prostate cancer Paternal Grandfather     Health Maintenance  Topic Date Due   COVID-19 Vaccine (3 - 2025-26 season) 02/26/2024   DTaP/Tdap/Td (10 - Td or Tdap) 08/21/2029   Influenza Vaccine  Completed   Hepatitis B Vaccines 19-59 Average Risk  Completed   HPV VACCINES  Completed   Hepatitis C Screening  Completed   HIV Screening  Completed   Meningococcal B Vaccine  Aged Out  Pneumococcal Vaccine  Discontinued     ----------------------------------------------------------------------------------------------------------------------------------------------------------------------------------------------------------------- Physical Exam BP 133/86 (BP Location: Left Arm, Patient Position: Sitting, Cuff Size: Normal)   Pulse 79   Ht 5' 11 (1.803 m)   Wt 217 lb 11.2 oz (98.7 kg)   SpO2 98%   BMI 30.36 kg/m   Physical Exam Constitutional:      General: He is not in acute distress. HENT:     Head: Normocephalic and atraumatic.     Right Ear: Tympanic membrane and external ear normal.     Left Ear: Tympanic membrane and external ear normal.  Eyes:      General: No scleral icterus. Neck:     Thyroid: No thyromegaly.  Cardiovascular:     Rate and Rhythm: Normal rate and regular rhythm.     Heart sounds: Normal heart sounds.  Pulmonary:     Effort: Pulmonary effort is normal.     Breath sounds: Normal breath sounds.  Abdominal:     General: Bowel sounds are normal. There is no distension.     Palpations: Abdomen is soft.     Tenderness: There is no abdominal tenderness. There is no guarding.  Musculoskeletal:     Cervical back: Normal range of motion.  Lymphadenopathy:     Cervical: No cervical adenopathy.  Skin:    General: Skin is warm and dry.     Findings: No rash.  Neurological:     Mental Status: He is alert and oriented to person, place, and time.     Cranial Nerves: No cranial nerve deficit.     Motor: No abnormal muscle tone.  Psychiatric:        Mood and Affect: Mood normal.        Behavior: Behavior normal.     ------------------------------------------------------------------------------------------------------------------------------------------------------------------------------------------------------------------- Assessment and Plan  Well adult exam Well adult Orders Placed This Encounter  Procedures   Chlamydia/Gonococcus/Trichomonas, NAA   Mycoplasma / Ureaplasma Culture   CMP14+EGFR   CBC with Differential/Platelet   Lipid Panel With LDL/HDL Ratio   RPR   HIV antibody (with reflex)  Screenings: Per lab orders Immunizations: Up-to-date Anticipatory guidance/risk factor reduction: Recommendations per AVS.   No orders of the defined types were placed in this encounter.   No follow-ups on file.

## 2024-05-02 NOTE — Assessment & Plan Note (Signed)
 Well adult Orders Placed This Encounter  Procedures   Chlamydia/Gonococcus/Trichomonas, NAA   Mycoplasma / Ureaplasma Culture   CMP14+EGFR   CBC with Differential/Platelet   Lipid Panel With LDL/HDL Ratio   RPR   HIV antibody (with reflex)  Screenings: Per lab orders Immunizations: Up-to-date Anticipatory guidance/risk factor reduction: Recommendations per AVS.

## 2024-05-02 NOTE — Patient Instructions (Signed)
 Preventive Care 11-26 Years Old, Male Preventive care refers to lifestyle choices and visits with your health care provider that can promote health and wellness. Preventive care visits are also called wellness exams. What can I expect for my preventive care visit? Counseling During your preventive care visit, your health care provider may ask about your: Medical history, including: Past medical problems. Family medical history. Current health, including: Emotional well-being. Home life and relationship well-being. Sexual activity. Lifestyle, including: Alcohol, nicotine or tobacco, and drug use. Access to firearms. Diet, exercise, and sleep habits. Safety issues such as seatbelt and bike helmet use. Sunscreen use. Work and work Astronomer. Physical exam Your health care provider may check your: Height and weight. These may be used to calculate your BMI (body mass index). BMI is a measurement that tells if you are at a healthy weight. Waist circumference. This measures the distance around your waistline. This measurement also tells if you are at a healthy weight and may help predict your risk of certain diseases, such as type 2 diabetes and high blood pressure. Heart rate and blood pressure. Body temperature. Skin for abnormal spots. What immunizations do I need?  Vaccines are usually given at various ages, according to a schedule. Your health care provider will recommend vaccines for you based on your age, medical history, and lifestyle or other factors, such as travel or where you work. What tests do I need? Screening Your health care provider may recommend screening tests for certain conditions. This may include: Lipid and cholesterol levels. Diabetes screening. This is done by checking your blood sugar (glucose) after you have not eaten for a while (fasting). Hepatitis B test. Hepatitis C test. HIV (human immunodeficiency virus) test. STI (sexually transmitted infection)  testing, if you are at risk. Talk with your health care provider about your test results, treatment options, and if necessary, the need for more tests. Follow these instructions at home: Eating and drinking  Eat a healthy diet that includes fresh fruits and vegetables, whole grains, lean protein, and low-fat dairy products. Drink enough fluid to keep your urine pale yellow. Take vitamin and mineral supplements as recommended by your health care provider. Do not drink alcohol if your health care provider tells you not to drink. If you drink alcohol: Limit how much you have to 0-2 drinks a day. Know how much alcohol is in your drink. In the [place] one drink equals one 12 oz bottle of beer (355 mL), one 5 oz glass of wine (148 mL), or one 1 oz glass of hard liquor (44 mL). Lifestyle Brush your teeth every morning and night with fluoride toothpaste. Floss one time each day. Exercise for at least 30 minutes 5 or more days each week. Do not use any products that contain nicotine or tobacco. These products include cigarettes, chewing tobacco, and vaping devices, such as e-cigarettes. If you need help quitting, ask your health care provider. Do not use drugs. If you are sexually active, practice safe sex. Use a condom or other form of protection to prevent STIs. Find healthy ways to manage stress, such as: Meditation, yoga, or listening to music. Journaling. Talking to a trusted person. Spending time with friends and family. Minimize exposure to UV radiation to reduce your risk of skin cancer. Safety Always wear your seat belt while driving or riding in a vehicle. Do not drive: If you have been drinking alcohol. Do not ride with someone who has been drinking. If you have been using any mind-altering substances  or drugs. While texting. When you are tired or distracted. Wear a helmet and other protective equipment during sports activities. If you have firearms in your house, make sure you  follow all gun safety procedures. Seek help if you have been physically or sexually abused. What's next? Go to your health care provider once a year for an annual wellness visit. Ask your health care provider how often you should have your eyes and teeth checked. Stay up to date on all vaccines. This information is not intended to replace advice given to you by your health care provider. Make sure you discuss any questions you have with your health care provider. Document Revised: 12/09/2020 Document Reviewed: 12/09/2020 [employer] Patient Education  2024 [employer].

## 2024-05-05 LAB — CHLAMYDIA/GONOCOCCUS/TRICHOMONAS, NAA
Chlamydia by NAA: NEGATIVE
Gonococcus by NAA: NEGATIVE
Trich vag by NAA: NEGATIVE

## 2024-05-09 ENCOUNTER — Ambulatory Visit: Payer: Self-pay | Admitting: Family Medicine

## 2024-05-09 LAB — CMP14+EGFR
ALT: 24 IU/L (ref 0–44)
AST: 22 IU/L (ref 0–40)
Albumin: 4.6 g/dL (ref 4.3–5.2)
Alkaline Phosphatase: 90 IU/L (ref 47–123)
BUN/Creatinine Ratio: 23 — ABNORMAL HIGH (ref 9–20)
BUN: 20 mg/dL (ref 6–20)
Bilirubin Total: 0.3 mg/dL (ref 0.0–1.2)
CO2: 22 mmol/L (ref 20–29)
Calcium: 9.7 mg/dL (ref 8.7–10.2)
Chloride: 99 mmol/L (ref 96–106)
Creatinine, Ser: 0.87 mg/dL (ref 0.76–1.27)
Globulin, Total: 2.6 g/dL (ref 1.5–4.5)
Glucose: 83 mg/dL (ref 70–99)
Potassium: 4.1 mmol/L (ref 3.5–5.2)
Sodium: 140 mmol/L (ref 134–144)
Total Protein: 7.2 g/dL (ref 6.0–8.5)
eGFR: 122 mL/min/1.73 (ref >59)

## 2024-05-09 LAB — MYCOPLASMA / UREAPLASMA CULTURE

## 2024-05-09 LAB — CBC WITH DIFFERENTIAL/PLATELET
Basophils Absolute: 0 x10E3/uL (ref 0.0–0.2)
Basos: 0 %
EOS (ABSOLUTE): 0.4 x10E3/uL (ref 0.0–0.4)
Eos: 4 %
Hematocrit: 44.8 % (ref 37.5–51.0)
Hemoglobin: 15.1 g/dL (ref 13.0–17.7)
Immature Grans (Abs): 0 x10E3/uL (ref 0.0–0.1)
Immature Granulocytes: 0 %
Lymphocytes Absolute: 2 x10E3/uL (ref 0.7–3.1)
Lymphs: 19 %
MCH: 29.4 pg (ref 26.6–33.0)
MCHC: 33.7 g/dL (ref 31.5–35.7)
MCV: 87 fL (ref 79–97)
Monocytes Absolute: 1 x10E3/uL — ABNORMAL HIGH (ref 0.1–0.9)
Monocytes: 9 %
Neutrophils Absolute: 6.9 x10E3/uL (ref 1.4–7.0)
Neutrophils: 68 %
Platelets: 300 x10E3/uL (ref 150–450)
RBC: 5.14 x10E6/uL (ref 4.14–5.80)
RDW: 11.9 % (ref 11.6–15.4)
WBC: 10.3 x10E3/uL (ref 3.4–10.8)

## 2024-05-09 LAB — HIV ANTIBODY (ROUTINE TESTING W REFLEX): HIV Screen 4th Generation wRfx: NONREACTIVE

## 2024-05-09 LAB — LIPID PANEL WITH LDL/HDL RATIO
Cholesterol, Total: 119 mg/dL (ref 100–199)
HDL: 37 mg/dL — ABNORMAL LOW (ref >39)
LDL Chol Calc (NIH): 58 mg/dL (ref 0–99)
LDL/HDL Ratio: 1.6 ratio (ref 0.0–3.6)
Triglycerides: 135 mg/dL (ref 0–149)
VLDL Cholesterol Cal: 24 mg/dL (ref 5–40)

## 2024-05-09 LAB — RPR: RPR Ser Ql: NONREACTIVE

## 2024-05-31 ENCOUNTER — Encounter: Payer: Self-pay | Admitting: Family Medicine
